# Patient Record
Sex: Male | Born: 1965 | Race: White | Hispanic: No | Marital: Married | Smoking: Former smoker
Health system: Southern US, Community
[De-identification: ages and names within clinical notes are randomized; demographics above are authoritative.]

## PROBLEM LIST (undated history)

## (undated) DIAGNOSIS — I10 Essential (primary) hypertension: Secondary | ICD-10-CM

## (undated) DIAGNOSIS — M1990 Unspecified osteoarthritis, unspecified site: Secondary | ICD-10-CM

## (undated) DIAGNOSIS — W3400XA Accidental discharge from unspecified firearms or gun, initial encounter: Secondary | ICD-10-CM

## (undated) DIAGNOSIS — E785 Hyperlipidemia, unspecified: Secondary | ICD-10-CM

## (undated) DIAGNOSIS — J309 Allergic rhinitis, unspecified: Secondary | ICD-10-CM

## (undated) DIAGNOSIS — I639 Cerebral infarction, unspecified: Secondary | ICD-10-CM

## (undated) HISTORY — DX: Unspecified osteoarthritis, unspecified site: M19.90

## (undated) HISTORY — PX: OTHER SURGICAL HISTORY: SHX169

## (undated) HISTORY — PX: HERNIA REPAIR: SHX51

## (undated) HISTORY — DX: Hyperlipidemia, unspecified: E78.5

## (undated) HISTORY — DX: Allergic rhinitis, unspecified: J30.9

## (undated) HISTORY — DX: Accidental discharge from unspecified firearms or gun, initial encounter: W34.00XA

---

## 2020-06-25 ENCOUNTER — Other Ambulatory Visit: Payer: Self-pay

## 2020-06-25 ENCOUNTER — Encounter (HOSPITAL_COMMUNITY): Payer: Self-pay | Admitting: *Deleted

## 2020-06-25 ENCOUNTER — Emergency Department (HOSPITAL_COMMUNITY): Payer: 59

## 2020-06-25 ENCOUNTER — Emergency Department (HOSPITAL_COMMUNITY)
Admission: EM | Admit: 2020-06-25 | Discharge: 2020-06-25 | Discharge status: Against Medical Advice | Payer: 59 | Attending: Emergency Medicine | Admitting: Emergency Medicine

## 2020-06-25 DIAGNOSIS — R5381 Other malaise: Secondary | ICD-10-CM | POA: Diagnosis not present

## 2020-06-25 DIAGNOSIS — R4781 Slurred speech: Secondary | ICD-10-CM | POA: Diagnosis not present

## 2020-06-25 DIAGNOSIS — R4189 Other symptoms and signs involving cognitive functions and awareness: Secondary | ICD-10-CM | POA: Insufficient documentation

## 2020-06-25 DIAGNOSIS — Z8673 Personal history of transient ischemic attack (TIA), and cerebral infarction without residual deficits: Secondary | ICD-10-CM | POA: Diagnosis not present

## 2020-06-25 DIAGNOSIS — I10 Essential (primary) hypertension: Secondary | ICD-10-CM | POA: Insufficient documentation

## 2020-06-25 DIAGNOSIS — R110 Nausea: Secondary | ICD-10-CM | POA: Diagnosis not present

## 2020-06-25 DIAGNOSIS — R410 Disorientation, unspecified: Secondary | ICD-10-CM

## 2020-06-25 DIAGNOSIS — R42 Dizziness and giddiness: Secondary | ICD-10-CM

## 2020-06-25 HISTORY — DX: Essential (primary) hypertension: I10

## 2020-06-25 HISTORY — DX: Cerebral infarction, unspecified: I63.9

## 2020-06-25 LAB — CBC
HCT: 45.6 % (ref 39.0–52.0)
Hemoglobin: 15.7 g/dL (ref 13.0–17.0)
MCH: 30.9 pg (ref 26.0–34.0)
MCHC: 34.4 g/dL (ref 30.0–36.0)
MCV: 89.8 fL (ref 80.0–100.0)
Platelets: 197 10*3/uL (ref 150–400)
RBC: 5.08 MIL/uL (ref 4.22–5.81)
RDW: 12.8 % (ref 11.5–15.5)
WBC: 7.6 10*3/uL (ref 4.0–10.5)
nRBC: 0 % (ref 0.0–0.2)

## 2020-06-25 LAB — COMPREHENSIVE METABOLIC PANEL
ALT: 33 U/L (ref 0–44)
AST: 36 U/L (ref 15–41)
Albumin: 4.2 g/dL (ref 3.5–5.0)
Alkaline Phosphatase: 62 U/L (ref 38–126)
Anion gap: 10 (ref 5–15)
BUN: 18 mg/dL (ref 6–20)
CO2: 22 mmol/L (ref 22–32)
Calcium: 8.9 mg/dL (ref 8.9–10.3)
Chloride: 105 mmol/L (ref 98–111)
Creatinine, Ser: 1.11 mg/dL (ref 0.61–1.24)
GFR, Estimated: >60 mL/min (ref >60)
Glucose, Bld: 101 mg/dL — ABNORMAL HIGH (ref 70–99)
Potassium: 4.6 mmol/L (ref 3.5–5.1)
Sodium: 137 mmol/L (ref 135–145)
Total Bilirubin: 0.6 mg/dL (ref 0.3–1.2)
Total Protein: 6.9 g/dL (ref 6.5–8.1)

## 2020-06-25 LAB — DIFFERENTIAL
Abs Immature Granulocytes: 0.03 10*3/uL (ref 0.00–0.07)
Basophils Absolute: 0.1 10*3/uL (ref 0.0–0.1)
Basophils Relative: 1 %
Eosinophils Absolute: 0.3 10*3/uL (ref 0.0–0.5)
Eosinophils Relative: 4 %
Immature Granulocytes: 0 %
Lymphocytes Relative: 36 %
Lymphs Abs: 2.7 10*3/uL (ref 0.7–4.0)
Monocytes Absolute: 0.6 10*3/uL (ref 0.1–1.0)
Monocytes Relative: 8 %
Neutro Abs: 3.9 10*3/uL (ref 1.7–7.7)
Neutrophils Relative %: 51 %

## 2020-06-25 LAB — CBG MONITORING, ED: Glucose-Capillary: 95 mg/dL (ref 70–99)

## 2020-06-25 LAB — PROTIME-INR
INR: 1 (ref 0.8–1.2)
Prothrombin Time: 12.7 seconds (ref 11.4–15.2)

## 2020-06-25 LAB — APTT: aPTT: 27 seconds (ref 24–36)

## 2020-06-25 IMAGING — CT CT ANGIO NECK
2 of 7 series · 8 of 33 positions shown · IV contrast (Omnipaque or Isovue)
Comparison: CT head [DATE]

CLINICAL DATA: Stroke/TIA.  Dizziness

EXAM:
CT ANGIOGRAPHY HEAD AND NECK
TECHNIQUE: Multidetector CT imaging of the head and neck was performed using
the standard protocol during bolus administration of intravenous
contrast. Multiplanar CT image reconstructions and MIPs were
obtained to evaluate the vascular anatomy. Carotid stenosis
measurements (when applicable) are obtained utilizing NASCET
criteria, using the distal internal carotid diameter as the
denominator.
CONTRAST:  75mL OMNIPAQUE IOHEXOL 350 MG/ML SOLN

[Series 5: cta head & neck · axial · 0.47mm/px · z∈[+1240,+1360]mm · 2 of 181 slices shown]
[im 61/181  soft-tissue]
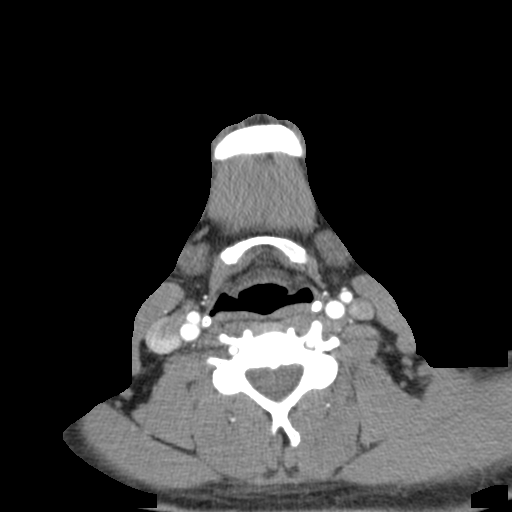
[im 121/181  soft-tissue]
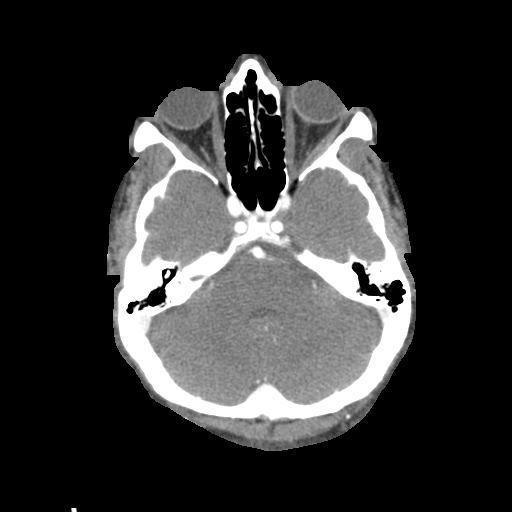

[Series 7: ax thins · axial · 0.45mm/px · z∈[+1172,+1429]mm · 6 of 361 slices shown]
[im 52/361  soft-tissue]
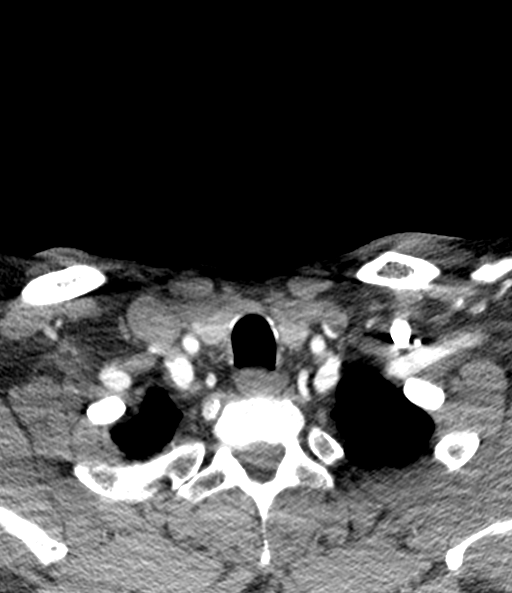
[im 103/361  bone]
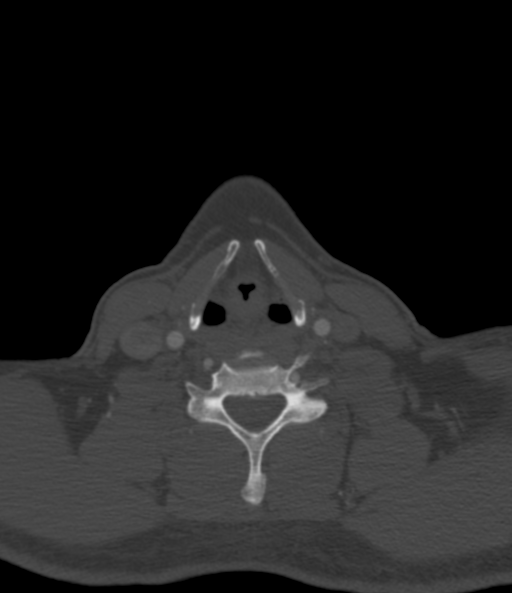
[im 155/361  soft-tissue]
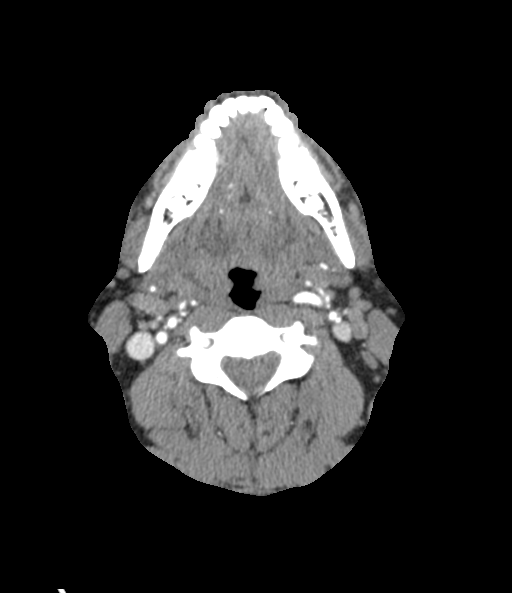
[im 206/361  bone]
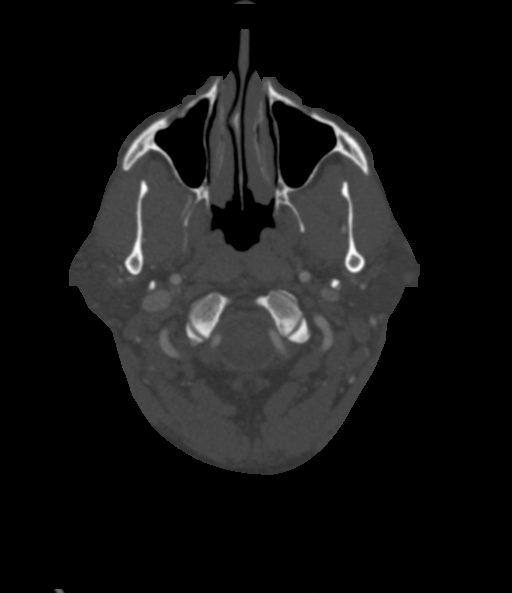
[im 258/361  soft-tissue]
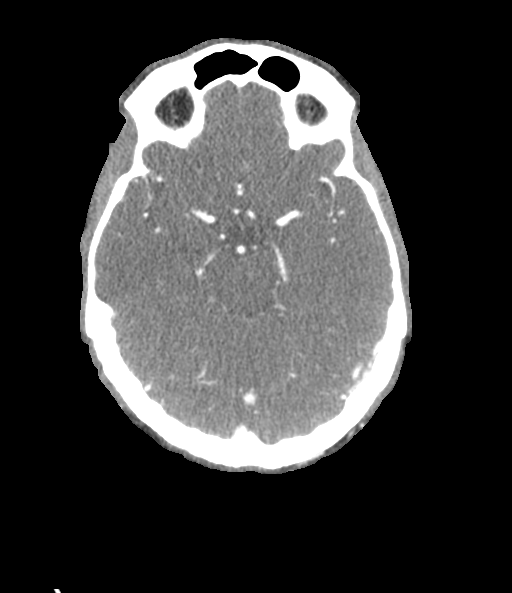
[im 309/361  bone]
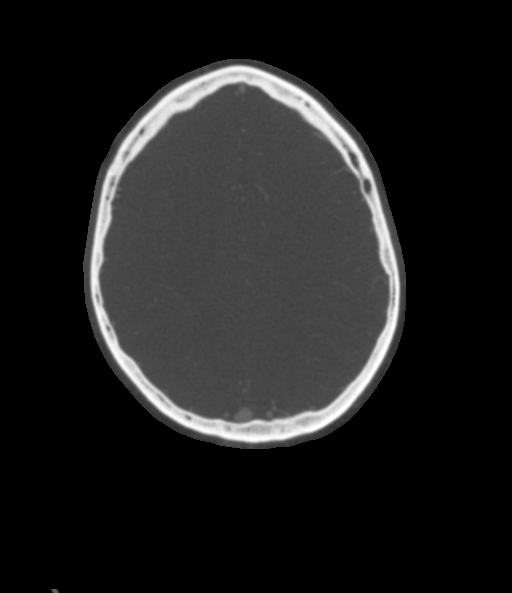

[8 of 33 positions shown; findings below may reference images not displayed]

FINDINGS: CTA NECK FINDINGS

Aortic arch: Standard branching. Imaged portion shows no evidence of
aneurysm or dissection. No significant stenosis of the major arch
vessel origins.

Right carotid system: Normal right carotid. Negative for stenosis or
atherosclerotic disease

Left carotid system: Normal left carotid. Negative for stenosis or
atherosclerotic disease.

Vertebral arteries: Normal vertebral artery bilaterally

Skeleton: Mild cervical degenerative change. No acute skeletal
abnormality.

Other neck: Negative for mass or adenopathy.

Upper chest: Lung apices clear bilaterally.

Review of the MIP images confirms the above findings

CTA HEAD FINDINGS

Anterior circulation: Normal cavernous carotid bilaterally without
stenosis. Anterior and middle cerebral arteries patent bilaterally
without stenosis. No large vessel occlusion.

Posterior circulation: Both vertebral arteries patent to the
basilar. PICA patent. Basilar widely patent. AICA, superior
cerebellar, posterior cerebral arteries normal bilaterally.

Venous sinuses: Normal venous enhancement

Anatomic variants: None

Review of the MIP images confirms the above findings
IMPRESSION: 1. Negative CTA head and neck.
2. No intracranial large vessel occlusion
3. No significant carotid or vertebral artery stenosis.

## 2020-06-25 IMAGING — CT CT HEAD CODE STROKE
3 series · 16 of 47 positions shown, 19 images · non-contrast
Comparison: None.

CLINICAL DATA: Code stroke.  Acute neuro deficit.  Dizziness.

EXAM:
CT HEAD WITHOUT CONTRAST
TECHNIQUE: Contiguous axial images were obtained from the base of the skull
through the vertex without intravenous contrast.

[Series 2: head w o · axial · 0.48mm/px · z∈[+1275,+1440]mm · 10 of 39 slices shown, 13 images]
[im 3/39  brain]
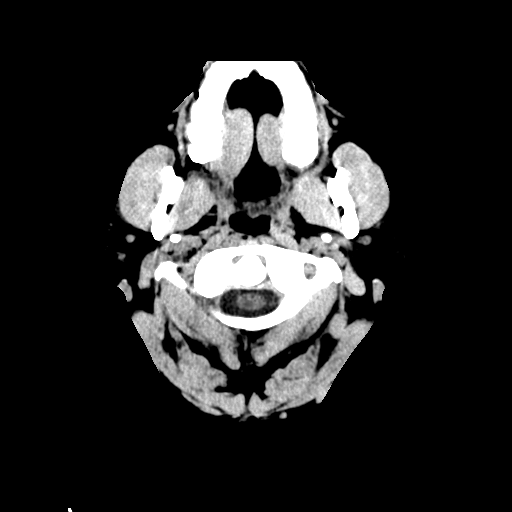
[im 3/39  bone]
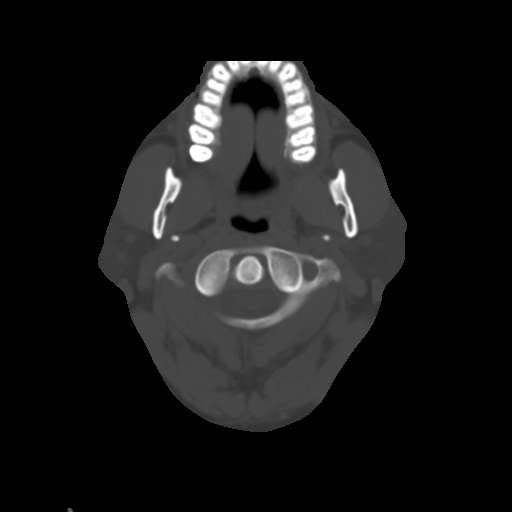
[im 7/39  brain]
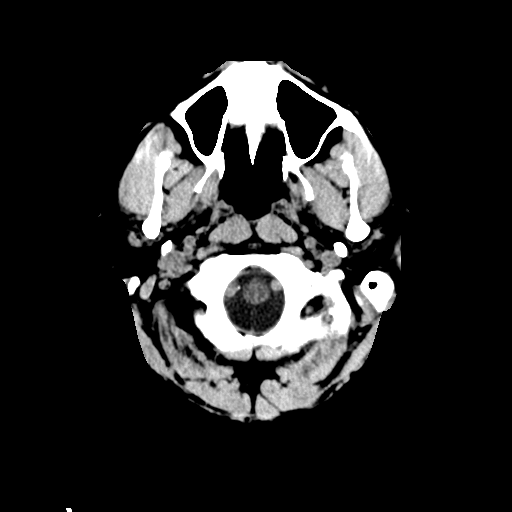
[im 11/39  brain]
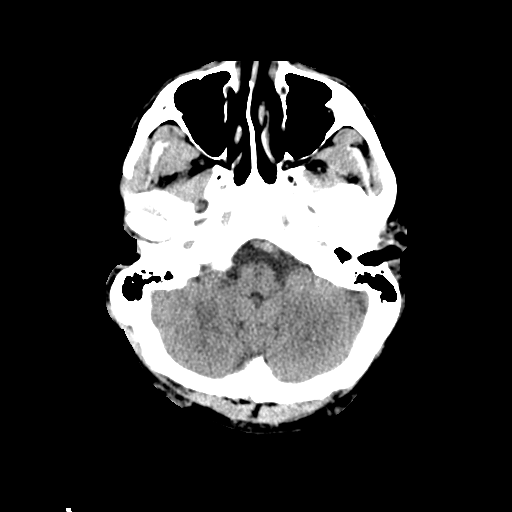
[im 14/39  brain]
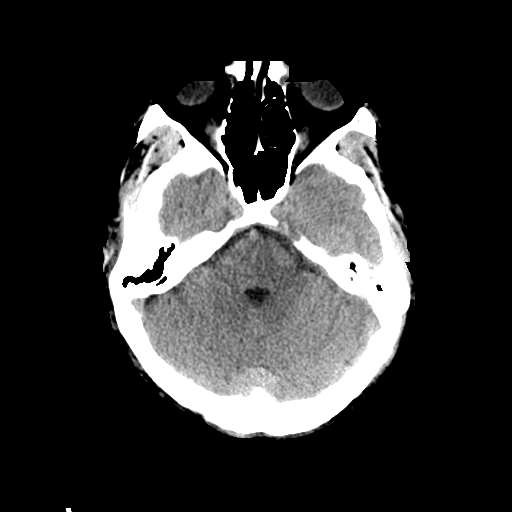
[im 18/39  brain]
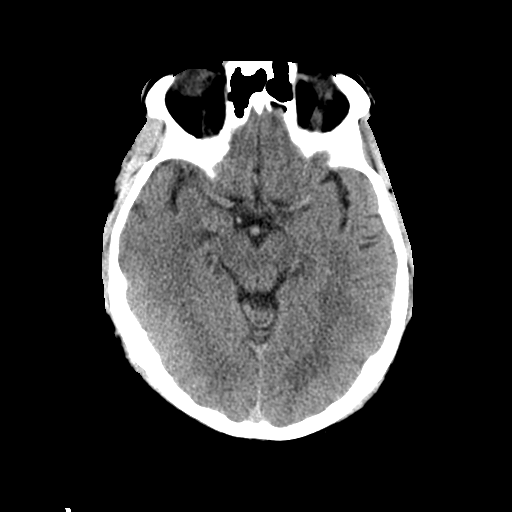
[im 18/39  bone]
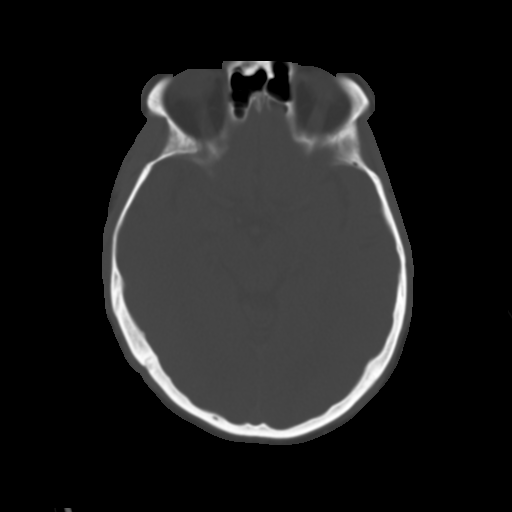
[im 21/39  brain]
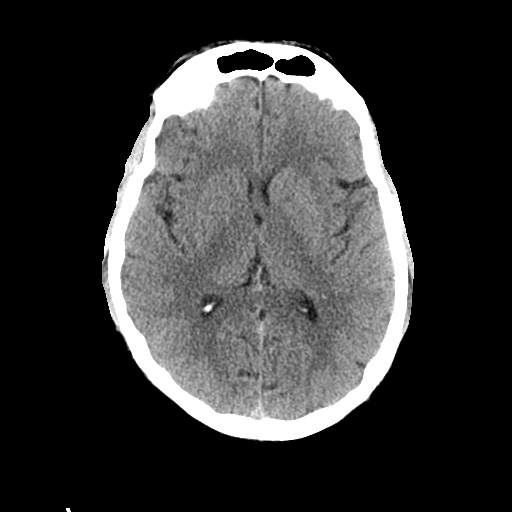
[im 25/39  brain]
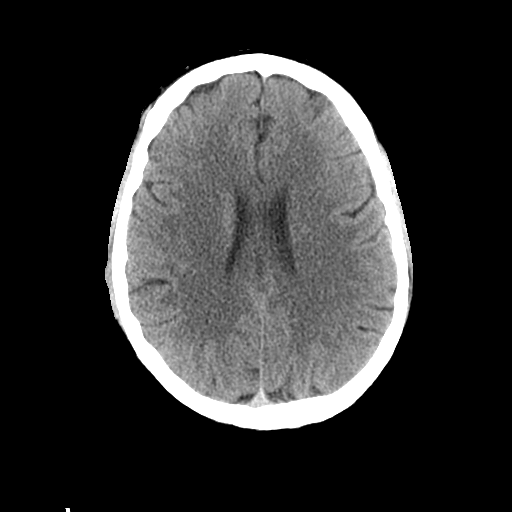
[im 29/39  brain]
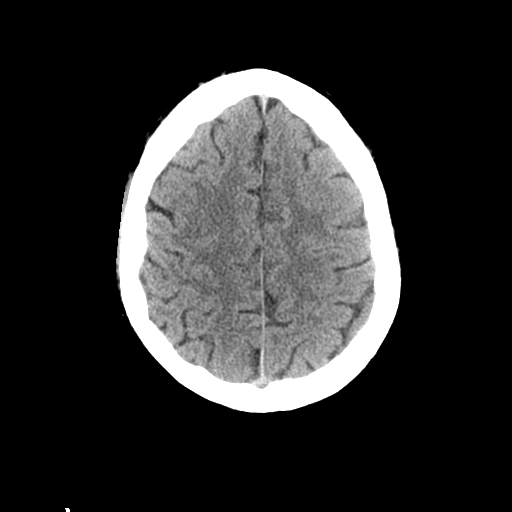
[im 32/39  brain]
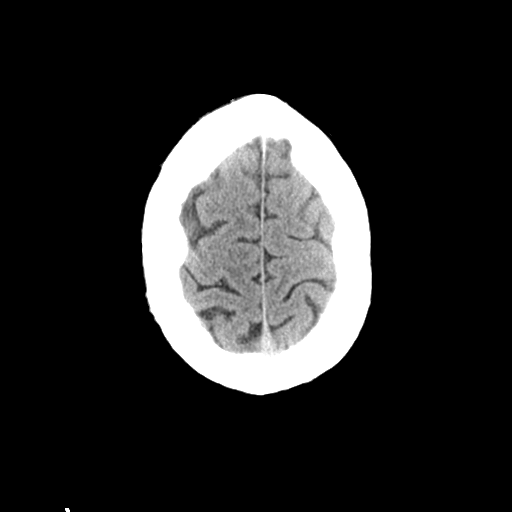
[im 32/39  bone]
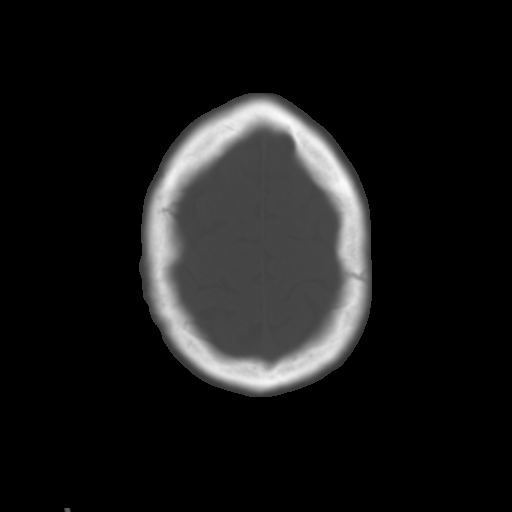
[im 36/39  brain]
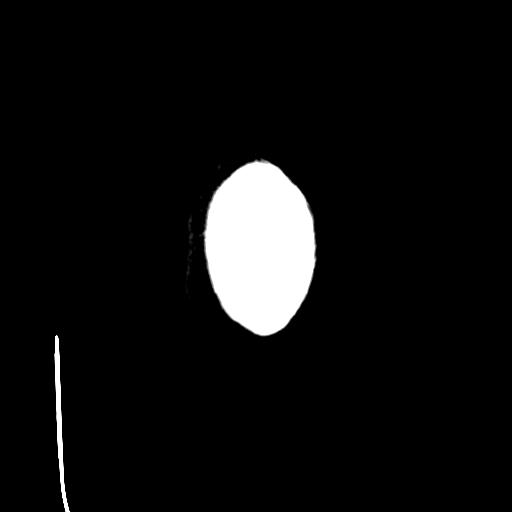

[Series 4: coronal soft · coronal · 0.36mm/px · 3 of 79 slices shown]
[im 27/79  brain]
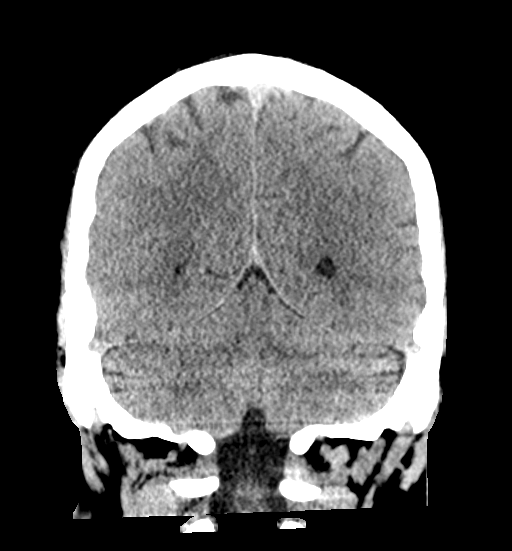
[im 35/79  brain]
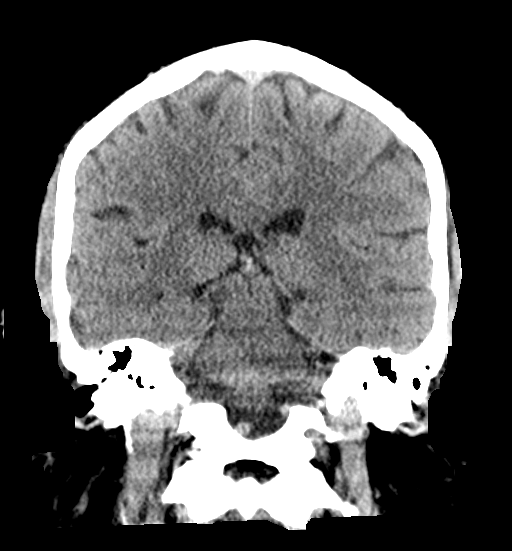
[im 44/79  brain]
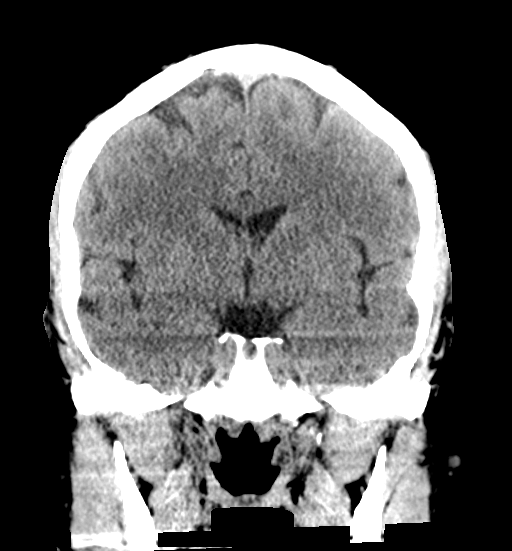

[Series 5: sagittal soft · sagittal · 0.41mm/px · 3 of 62 slices shown]
[im 21/62  brain]
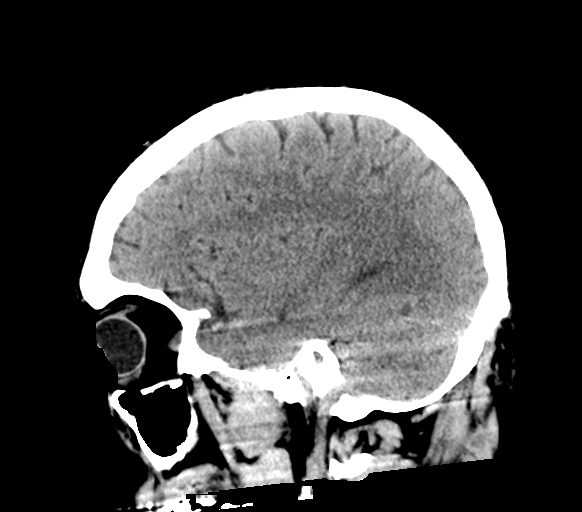
[im 31/62  brain]
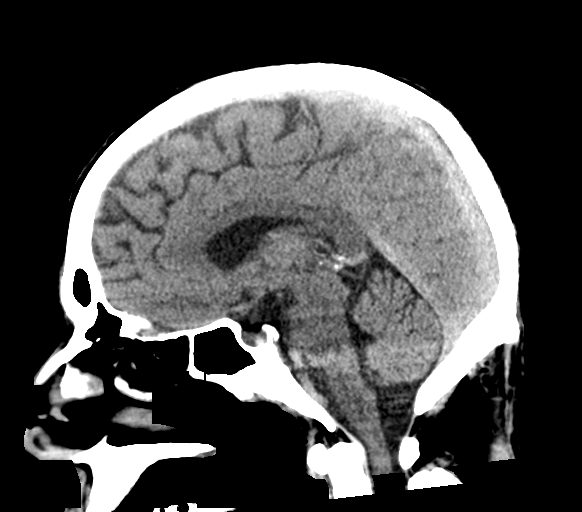
[im 41/62  brain]
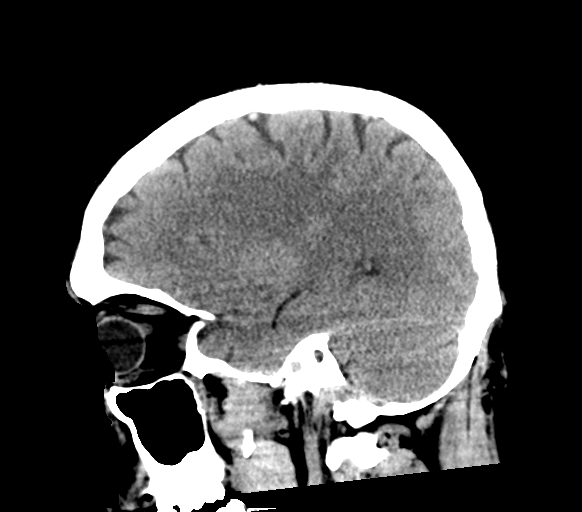

[16 of 47 positions shown; findings below may reference images not displayed]

FINDINGS: Brain: No evidence of acute infarction, hemorrhage, hydrocephalus,
extra-axial collection or mass lesion/mass effect.

Vascular: Negative for hyperdense vessel

Skull: Negative

Sinuses/Orbits: Paranasal sinuses clear.  Negative orbit

Other: None

ASPECTS (Alberta Stroke Program Early CT Score)

- Ganglionic level infarction (caudate, lentiform nuclei, internal
capsule, insula, M1-M3 cortex): 7

- Supraganglionic infarction (M4-M6 cortex): 3

Total score (0-10 with 10 being normal): 10
IMPRESSION: 1. Negative CT head
2. ASPECTS is 10
3. These results were called by telephone at the time of
interpretation on [DATE] at [DATE] to provider AKE , who
verbally acknowledged these results.

## 2020-06-25 MED ORDER — SODIUM CHLORIDE 0.9% FLUSH: 3.0000 mL | Freq: Once | INTRAVENOUS | Status: DC

## 2020-06-25 MED ORDER — IOHEXOL 350 MG/ML SOLN
75.0000 mL | Freq: Once | INTRAVENOUS | Status: AC | PRN
  Administered 2020-06-25: 75 mL via INTRAVENOUS
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
## 2020-06-25 NOTE — Discharge Instructions (Addendum)
You are leaving the emergency department AGAINST MEDICAL ADVICE.  We discussed the risks of leaving the hospital at this time, including stroke, seizure, death.  As discussed, you can return to the emergency department at any time for continued care.

## 2020-06-25 NOTE — ED Provider Notes (Signed)
[facility]-EMERGENCY DEPT [facility] Emergency Department Provider Note MRN:  [id number]  Arrival date & time: 06/25/20     Chief Complaint   Dizziness   History of Present Illness   Richard Wagner is a 55 y.o. year-old male with a history of hypertension, presenting to the ED with chief complaint of dizziness.  Patient was his normal self at 5 PM, he got into the car and went to the store.  He called his wife at 69 complaining of severe dizziness, malaise, nausea.  He was exhibiting slurred speech on the phone.  He pulled the car over.  He is now in the emergency department minimally responsive.  I was unable to obtain an accurate HPI, PMH, or ROS due to the patient's altered mental status.  Level 5 caveat.  Review of Systems  A complete 10 system review of systems was obtained and all systems are negative except as noted in the HPI and PMH.   Patient's Health History    Past Medical History:  Diagnosis Date  . Hypertension   . Stroke [facility])     History reviewed. No pertinent surgical history.  History reviewed. No pertinent family history.  Social History   Socioeconomic History  . Marital status: Single    Spouse name: Not on file  . Number of children: Not on file  . Years of education: Not on file  . Highest education level: Not on file  Occupational History  . Not on file  Tobacco Use  . Smoking status: Not on file  . Smokeless tobacco: Not on file  Substance and Sexual Activity  . Alcohol use: Not on file  . Drug use: Not on file  . Sexual activity: Not on file  Other Topics Concern  . Not on file  Social History Narrative  . Not on file   Social Determinants of Health   Financial Resource Strain: Not on file  Food Insecurity: Not on file  Transportation Needs: Not on file  Physical Activity: Not on file  Stress: Not on file  Social Connections: Not on file  Intimate Partner Violence: Not on file     Physical Exam   Vitals:   06/25/20 1918  06/25/20 2000  BP: (!) 147/96 (!) 145/127  Pulse: 68 63  Resp: 14 16  Temp: 97.8 F (36.6 C)   SpO2: 96% 95%    CONSTITUTIONAL: Well-appearing, NAD NEURO: Somnolent, responds minimally to painful stimulus EYES:  eyes equal and reactive ENT/NECK:  no LAD, no JVD CARDIO: Regular rate, well-perfused, normal S1 and S2 PULM:  CTAB no wheezing or rhonchi GI/GU:  normal bowel sounds, non-distended, non-tender MSK/SPINE:  No gross deformities, no edema SKIN:  no rash, atraumatic PSYCH:  Appropriate speech and behavior  *Additional and/or pertinent findings included in MDM below  Diagnostic and Interventional Summary    EKG Interpretation  Date/Time:  Saturday June 25 2020 18:33:48 EST Ventricular Rate:  67 PR Interval:    QRS Duration: 87 QT Interval:  414 QTC Calculation: 437 R Axis:   66 Text Interpretation: Sinus rhythm RSR' in V1 or V2, right VCD or RVH Probable anterolateral infarct, old Confirmed by Kennis Carina [id number]) on 06/25/2020 8:16:39 PM      Labs Reviewed  COMPREHENSIVE METABOLIC PANEL - Abnormal; Notable for the following components:      Result Value   Glucose, Bld 101 (*)    All other components within normal limits  PROTIME-INR  APTT  CBC  DIFFERENTIAL  CBG MONITORING,  ED  I-STAT CHEM 8, ED    CT Angio Head W or Wo Contrast  Final Result    CT Angio Neck W and/or Wo Contrast  Final Result    CT HEAD CODE STROKE WO CONTRAST  Final Result      Medications  sodium chloride flush (NS) 0.9 % injection 3 mL (has no administration in time range)  iohexol (OMNIPAQUE) 350 MG/ML injection 75 mL (75 mLs Intravenous Contrast Given 06/25/20 1850)     Procedures  /  Critical Care .Critical Care Performed by: Sabas Sous, MD Authorized by: Sabas Sous, MD   Critical care provider statement:    Critical care time (minutes):  35   Critical care was necessary to treat or prevent imminent or life-threatening deterioration of the following  conditions: Concern for acute ischemic stroke, initiation of code stroke protocol.   Critical care was time spent personally by me on the following activities:  Discussions with consultants, evaluation of patient's response to treatment, examination of patient, ordering and performing treatments and interventions, ordering and review of laboratory studies, ordering and review of radiographic studies, pulse oximetry, re-evaluation of patient's condition, obtaining history from patient or surrogate and review of old charts    ED Course and Medical Decision Making  I have reviewed the triage vital signs, the nursing notes, and pertinent available records from the EMR.  Listed above are laboratory and imaging tests that I personally ordered, reviewed, and interpreted and then considered in my medical decision making (see below for details).  Given the severe dizziness, slurred speech now somnolence there is concern for acute ischemic stroke versus hemorrhagic stroke.  Code stroke was initiated.  Noncon CT reassuring, patient had a spontaneous recovery while in CT, he is now much more awake and alert.  Family denies any concern over drug use.  Work-up thus far is reassuring.  Evaluated by neurology who recommends admission, MRI, EEG.  Patient is refusing all this, he wishes to go home we explained the risks of leaving at this time and still patient is refusing further treatment.  He is leaving the hospital AGAINST MEDICAL ADVICE, fully aware of the risks of stroke, seizure, death if he were to leave       Elmer Sow. Pilar Plate, MD [facility] Emergency Medicine [facility]ician [facility] [email or web address]  Final Clinical Impressions(s) / ED Diagnoses     ICD-10-CM   1. Dizziness  R42   2. Slurred speech  R47.81   3. Unresponsive episode  R41.89     ED Discharge Orders    None       Discharge Instructions Discussed with and Provided to Patient:     Discharge Instructions     You are  leaving the emergency department AGAINST MEDICAL ADVICE.  We discussed the risks of leaving the hospital at this time, including stroke, seizure, death.  As discussed, you can return to the emergency department at any time for continued care.      Sabas Sous, MD 06/25/20 2033

## 2020-06-25 NOTE — ED Notes (Signed)
Pt to CT

## 2020-06-25 NOTE — Progress Notes (Signed)
Code Stroke Time Documentation   1827 Call Time  Beeper Time 1830 Exam Started  1837 Exam Finished 1839 Images sent to [facility] 1839 Exam completed in Epic  [phone] [facility] radiology called

## 2020-06-25 NOTE — ED Notes (Signed)
Tele-neurologists at bedside

## 2020-06-25 NOTE — ED Triage Notes (Signed)
Pt with dizziness, vision changes and slurred speech at 1715.  Last seen well at 1700.  Pt lethargic.

## 2020-06-28 ENCOUNTER — Other Ambulatory Visit: Payer: Self-pay

## 2020-06-28 ENCOUNTER — Encounter: Payer: Self-pay | Admitting: Nurse Practitioner

## 2020-06-28 ENCOUNTER — Ambulatory Visit (INDEPENDENT_AMBULATORY_CARE_PROVIDER_SITE_OTHER): Payer: 59 | Admitting: Nurse Practitioner

## 2020-06-28 VITALS — BP 144/100 | HR 74 | Temp 98.3°F | Resp 18 | Ht 73.0 in | Wt 240.0 lb

## 2020-06-28 DIAGNOSIS — I10 Essential (primary) hypertension: Secondary | ICD-10-CM | POA: Diagnosis not present

## 2020-06-28 DIAGNOSIS — M79671 Pain in right foot: Secondary | ICD-10-CM

## 2020-06-28 DIAGNOSIS — M79672 Pain in left foot: Secondary | ICD-10-CM

## 2020-06-28 DIAGNOSIS — Z7689 Persons encountering health services in other specified circumstances: Secondary | ICD-10-CM

## 2020-06-28 DIAGNOSIS — Z23 Encounter for immunization: Secondary | ICD-10-CM

## 2020-06-28 DIAGNOSIS — R42 Dizziness and giddiness: Secondary | ICD-10-CM

## 2020-06-28 MED ORDER — LOSARTAN POTASSIUM 50 MG PO TABS: 50.0000 mg | ORAL_TABLET | Freq: Every day | ORAL | 3 refills | Status: DC
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
## 2020-06-28 NOTE — Patient Instructions (Addendum)
It was nice meeting you today.  We will meet back up in 1-2 weeks for a physical exam.  Please get fasting labs 2-3 days prior to your next visit.  For your high blood pressure, I called in losartan. Please take this as prescribed and check your BP at least once per day. Please record your reading and bring in a log of those readings at our next visit.  If you have any neurological issues or lightheadedness gets worse, report to the emergency department for imaging and treatment.  Plantar Fasciitis   Plantar fasciitis is a painful foot condition that affects the heel. It occurs when the band of tissue that connects the toes to the heel bone (plantar fascia) becomes irritated. This can happen as the result of exercising too much or doing other repetitive activities (overuse injury). The pain from plantar fasciitis can range from mild irritation to severe pain that makes it difficult to walk or move. The pain is usually worse in the morning after sleeping, or after sitting or lying down for a while. Pain may also be worse after long periods of walking or standing. What are the causes? This condition may be caused by:  Standing for long periods of time.  Wearing shoes that do not have good arch support.  Doing activities that put stress on joints (high-impact activities), including running, aerobics, and ballet.  Being overweight.  An abnormal way of walking (gait).  Tight muscles in the back of your lower leg (calf).  High arches in your feet.  Starting a new athletic activity. What are the signs or symptoms? The main symptom of this condition is heel pain. Pain may:  Be worse with first steps after a time of rest, especially in the morning after sleeping or after you have been sitting or lying down for a while.  Be worse after long periods of standing still.  Decrease after 30-45 minutes of activity, such as gentle walking. How is this diagnosed? This condition may be diagnosed  based on your medical history and your symptoms. Your health care provider may ask questions about your activity level. Your health care provider will do a physical exam to check for:  A tender area on the bottom of your foot.  A high arch in your foot.  Pain when you move your foot.  Difficulty moving your foot. You may have imaging tests to confirm the diagnosis, such as:  X-rays.  Ultrasound.  MRI. How is this treated? Treatment for plantar fasciitis depends on how severe your condition is. Treatment may include:  Rest, ice, applying pressure (compression), and raising the affected foot (elevation). This may be called RICE therapy. Your health care provider may recommend RICE therapy along with over-the-counter pain medicines to manage your pain.  Exercises to stretch your calves and your plantar fascia.  A splint that holds your foot in a stretched, upward position while you sleep (night splint).  Physical therapy to relieve symptoms and prevent problems in the future.  Injections of steroid medicine (cortisone) to relieve pain and inflammation.  Stimulating your plantar fascia with electrical impulses (extracorporeal shock wave therapy). This is usually the last treatment option before surgery.  Surgery, if other treatments have not worked after 12 months. Follow these instructions at home:   Managing pain, stiffness, and swelling  If directed, put ice on the painful area: ? Put ice in a plastic bag, or use a frozen bottle of water. ? Place a towel between your skin  and the bag or bottle. ? Roll the bottom of your foot over the bag or bottle. ? Do this for 20 minutes, 2-3 times a day.  Wear athletic shoes that have air-sole or gel-sole cushions, or try wearing soft shoe inserts that are designed for plantar fasciitis.  Raise (elevate) your foot above the level of your heart while you are sitting or lying down. Activity  Avoid activities that cause pain. Ask your  health care provider what activities are safe for you.  Do physical therapy exercises and stretches as told by your health care provider.  Try activities and forms of exercise that are easier on your joints (low-impact). Examples include swimming, water aerobics, and biking. General instructions  Take over-the-counter and prescription medicines only as told by your health care provider.  Wear a night splint while sleeping, if told by your health care provider. Loosen the splint if your toes tingle, become numb, or turn cold and blue.  Maintain a healthy weight, or work with your health care provider to lose weight as needed.  Keep all follow-up visits as told by your health care provider. This is important. Contact a health care provider if you:  Have symptoms that do not go away after caring for yourself at home.  Have pain that gets worse.  Have pain that affects your ability to move or do your daily activities. Summary  Plantar fasciitis is a painful foot condition that affects the heel. It occurs when the band of tissue that connects the toes to the heel bone (plantar fascia) becomes irritated.  The main symptom of this condition is heel pain that may be worse after exercising too much or standing still for a long time.  Treatment varies, but it usually starts with rest, ice, compression, and elevation (RICE therapy) and over-the-counter medicines to manage pain. This information is not intended to replace advice given to you by your health care provider. Make sure you discuss any questions you have with your health care provider. Document Revised: 05/24/2017 Document Reviewed: 04/08/2017 Elsevier Patient Education  [place].    Plantar Fasciitis Rehab Ask your health care provider which exercises are safe for you. Do exercises exactly as told by your health care provider and adjust them as directed. It is normal to feel mild stretching, pulling, tightness, or  discomfort as you do these exercises. Stop right away if you feel sudden pain or your pain gets worse. Do not begin these exercises until told by your health care provider. Stretching and range-of-motion exercises These exercises warm up your muscles and joints and improve the movement and flexibility of your foot. These exercises also help to relieve pain. Plantar fascia stretch   1. Sit with your left / right leg crossed over your opposite knee. 2. Hold your heel with one hand with that thumb near your arch. With your other hand, hold your toes and gently pull them back toward the top of your foot. You should feel a stretch on the bottom of your toes or your foot (plantar fascia) or both. 3. Hold this stretch for__________ seconds. 4. Slowly release your toes and return to the starting position. Repeat __________ times. Complete this exercise __________ times a day. Gastrocnemius stretch, standing  This exercise is also called a calf (gastroc) stretch. It stretches the muscles in the back of the upper calf. 1. Stand with your hands against a wall. 2. Extend your left / right leg behind you, and bend your front knee  slightly. 3. Keeping your heels on the floor and your back knee straight, shift your weight toward the wall. Do not arch your back. You should feel a gentle stretch in your upper left / right calf. 4. Hold this position for __________ seconds. Repeat __________ times. Complete this exercise __________ times a day. Soleus stretch, standing This exercise is also called a calf (soleus) stretch. It stretches the muscles in the back of the lower calf. 1. Stand with your hands against a wall. 2. Extend your left / right leg behind you, and bend your front knee slightly. 3. Keeping your heels on the floor, bend your back knee and shift your weight slightly over your back leg. You should feel a gentle stretch deep in your lower calf. 4. Hold this position for __________ seconds. Repeat  __________ times. Complete this exercise __________ times a day. Gastroc and soleus stretch, standing step This exercise stretches the muscles in the back of the lower leg. These muscles are in the upper calf (gastrocnemius) and the lower calf (soleus). 1. Stand with the ball of your left / right foot on a step. The ball of your foot is on the walking surface, right under your toes. 2. Keep your other foot firmly on the same step. 3. Hold on to the wall or a railing for balance. 4. Slowly lift your other foot, allowing your body weight to press your left / right heel down over the edge of the step. You should feel a stretch in your left / right calf. 5. Hold this position for __________ seconds. 6. Return both feet to the step. 7. Repeat this exercise with a slight bend in your left / right knee. Repeat __________ times with your left / right knee straight and __________ times with your left / right knee bent. Complete this exercise __________ times a day. Balance exercise This exercise builds your balance and strength control of your arch to help take pressure off your plantar fascia. Single leg stand If this exercise is too easy, you can try it with your eyes closed or while standing on a pillow. 1. Without shoes, stand near a railing or in a doorway. You may hold on to the railing or door frame as needed. 2. Stand on your left / right foot. Keep your big toe down on the floor and try to keep your arch lifted. Do not let your foot roll inward. 3. Hold this position for __________ seconds. Repeat __________ times. Complete this exercise __________ times a day. This information is not intended to replace advice given to you by your health care provider. Make sure you discuss any questions you have with your health care provider. Document Revised: 10/02/2018 Document Reviewed: 04/09/2018 Elsevier Patient Education  2020 [employer].

## 2020-06-28 NOTE — Assessment & Plan Note (Signed)
-  flu shot administered today °

## 2020-06-28 NOTE — Assessment & Plan Note (Addendum)
-  BP 144/100 today -Rx. Losartan -recheck BP in 1-2 weeks at time of physical -encouraged home BP checks

## 2020-06-28 NOTE — Assessment & Plan Note (Signed)
-  likely plantar fasciitis based on symptoms (worse in AM, tightness in bottom of foot) -hand out of stretches provided

## 2020-06-28 NOTE — Assessment & Plan Note (Signed)
-  will screen for HIV and HCV -will discuss other preventative maintenance at future visits

## 2020-06-28 NOTE — Assessment & Plan Note (Signed)
-  he had a likely TIA on 06/25/20, but left AMA before getting MRI of brain -CT was negative -he still feels a little lightheaded -he states he has hx of CVA -will treat BP which has been elevated -no focal deficits today -if he has any further neuro symptoms he should go to ED immeidately

## 2020-07-02 LAB — LIPID PANEL WITH LDL/HDL RATIO
Cholesterol, Total: 255 mg/dL — ABNORMAL HIGH (ref 100–199)
HDL: 39 mg/dL — ABNORMAL LOW (ref >39)
LDL Chol Calc (NIH): 89 mg/dL (ref 0–99)
LDL/HDL Ratio: 2.3 ratio (ref 0.0–3.6)
Triglycerides: 769 mg/dL (ref 0–149)
VLDL Cholesterol Cal: 127 mg/dL — ABNORMAL HIGH (ref 5–40)

## 2020-07-02 LAB — HEMOGLOBIN A1C
Est. average glucose Bld gHb Est-mCnc: 114 mg/dL
Hgb A1c MFr Bld: 5.6 % (ref 4.8–5.6)

## 2020-07-04 ENCOUNTER — Encounter: Payer: Self-pay | Admitting: Nurse Practitioner

## 2020-07-04 ENCOUNTER — Telehealth: Payer: Self-pay

## 2020-07-04 ENCOUNTER — Other Ambulatory Visit: Payer: Self-pay | Admitting: Nurse Practitioner

## 2020-07-04 MED ORDER — FENOFIBRATE 145 MG PO TABS: 145.0000 mg | ORAL_TABLET | Freq: Every day | ORAL | 1 refills | Status: DC

## 2020-07-04 MED ORDER — OMEGA-3-ACID ETHYL ESTERS 1 G PO CAPS: 2.0000 g | ORAL_CAPSULE | Freq: Two times a day (BID) | ORAL | 1 refills | Status: DC

## 2020-07-04 MED ORDER — ICOSAPENT ETHYL 1 G PO CAPS: 2.0000 g | ORAL_CAPSULE | Freq: Two times a day (BID) | ORAL | 5 refills | Status: DC

## 2020-07-04 NOTE — Telephone Encounter (Signed)
Per pharmacy - pt insurance does not cover the Vascepa 1 gram capsule, insurance will cover Lovaza 1 gram capsules.

## 2020-07-04 NOTE — Progress Notes (Signed)
Your triglycerides are significantly elevated.  Did you eat anything prior to getting your labs drawn? If not, I will send something in to bring your triglycerides down.

## 2020-07-04 NOTE — Progress Notes (Signed)
I sent in fenofibrate as well as Vascepa. Fenofibrate should be prefererd by your insurance. Vascepa may be expensive. If vascepa is expensive, you don't have to fill it. Just get fish oil capsules over-the-counter and take 2 grams (or 2000 mg) of fish oil twice per day. Usually, it is a total of 4 capsules per day.

## 2020-07-04 NOTE — Telephone Encounter (Signed)
Sent in Lovaza

## 2020-07-05 ENCOUNTER — Ambulatory Visit (INDEPENDENT_AMBULATORY_CARE_PROVIDER_SITE_OTHER): Payer: 59 | Admitting: Nurse Practitioner

## 2020-07-05 ENCOUNTER — Other Ambulatory Visit: Payer: Self-pay

## 2020-07-05 ENCOUNTER — Encounter: Payer: Self-pay | Admitting: Nurse Practitioner

## 2020-07-05 VITALS — BP 132/94 | HR 80 | Temp 98.4°F | Resp 16 | Ht 74.0 in | Wt 239.0 lb

## 2020-07-05 DIAGNOSIS — R42 Dizziness and giddiness: Secondary | ICD-10-CM

## 2020-07-05 DIAGNOSIS — Z596 Low income: Secondary | ICD-10-CM | POA: Diagnosis not present

## 2020-07-05 DIAGNOSIS — E781 Pure hyperglyceridemia: Secondary | ICD-10-CM

## 2020-07-05 DIAGNOSIS — Z139 Encounter for screening, unspecified: Secondary | ICD-10-CM | POA: Diagnosis not present

## 2020-07-05 DIAGNOSIS — E785 Hyperlipidemia, unspecified: Secondary | ICD-10-CM | POA: Insufficient documentation

## 2020-07-05 MED ORDER — MECLIZINE HCL 25 MG PO TABS: 25.0000 mg | ORAL_TABLET | Freq: Three times a day (TID) | ORAL | 0 refills | Status: DC | PRN
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
## 2020-07-05 NOTE — Assessment & Plan Note (Signed)
-  positive Dix-Hallpike today -Rx. Meclizine -has hx of CVA, but this dizziness is acute x 4 days -previous imaging on 06/25/20 was negative

## 2020-07-05 NOTE — Patient Instructions (Signed)
For your dizziness, we started you on meclizine. If there is no improvement in about a week or symptoms get worse, return to the clinic.  For high triglycerides, you were started on Lovaza and fenofibrate. We will need to recheck your blood work in 3 months to make sure that your triglycerides are improving. Please get fasting labs 2-3 days prior to your visit.

## 2020-07-05 NOTE — Assessment & Plan Note (Signed)
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

## 2020-07-05 NOTE — Assessment & Plan Note (Signed)
-  trigs are significantly elevated -he was started on Lovaza and fenofibrate -repeat labs in 3 months

## 2020-07-12 ENCOUNTER — Encounter: Payer: 59 | Admitting: Nurse Practitioner

## 2020-07-21 ENCOUNTER — Other Ambulatory Visit: Payer: Self-pay

## 2020-07-21 ENCOUNTER — Encounter: Payer: Self-pay | Admitting: Nurse Practitioner

## 2020-07-21 ENCOUNTER — Ambulatory Visit (INDEPENDENT_AMBULATORY_CARE_PROVIDER_SITE_OTHER): Payer: 59 | Admitting: Nurse Practitioner

## 2020-07-21 DIAGNOSIS — M545 Low back pain, unspecified: Secondary | ICD-10-CM | POA: Insufficient documentation

## 2020-07-21 DIAGNOSIS — M5441 Lumbago with sciatica, right side: Secondary | ICD-10-CM

## 2020-07-21 DIAGNOSIS — R42 Dizziness and giddiness: Secondary | ICD-10-CM

## 2020-07-21 DIAGNOSIS — M79671 Pain in right foot: Secondary | ICD-10-CM

## 2020-07-21 DIAGNOSIS — I10 Essential (primary) hypertension: Secondary | ICD-10-CM | POA: Diagnosis not present

## 2020-07-21 DIAGNOSIS — M5442 Lumbago with sciatica, left side: Secondary | ICD-10-CM | POA: Diagnosis not present

## 2020-07-21 DIAGNOSIS — M79672 Pain in left foot: Secondary | ICD-10-CM

## 2020-07-21 DIAGNOSIS — G8929 Other chronic pain: Secondary | ICD-10-CM | POA: Insufficient documentation

## 2020-07-21 MED ORDER — IBUPROFEN 800 MG PO TABS: 800.0000 mg | ORAL_TABLET | Freq: Three times a day (TID) | ORAL | 0 refills | Status: DC | PRN

## 2020-07-21 MED ORDER — TIZANIDINE HCL 4 MG PO TABS: 4.0000 mg | ORAL_TABLET | Freq: Four times a day (QID) | ORAL | 0 refills | Status: DC | PRN

## 2020-07-21 MED ORDER — KETOROLAC TROMETHAMINE 60 MG/2ML IM SOLN
60.0000 mg | Freq: Once | INTRAMUSCULAR | Status: AC
  Administered 2020-07-21: 60 mg via INTRAMUSCULAR

## 2020-07-21 MED ORDER — PREDNISONE 10 MG PO TABS: ORAL_TABLET | ORAL | 0 refills | Status: AC
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
## 2020-07-21 NOTE — Assessment & Plan Note (Signed)
-  he states meclizine works for vertigo, but it is making him sleepy -we discussed taking half of a tablet when he has to work to reduce the drowsiness.

## 2020-07-21 NOTE — Assessment & Plan Note (Signed)
BP well controlled. Continue losartan  ?

## 2020-07-21 NOTE — Addendum Note (Signed)
Addended by: Jerilynn Mages on: 07/21/2020 04:34 PM   Modules accepted: Orders
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
## 2020-07-21 NOTE — Assessment & Plan Note (Addendum)
-  today right is worse than left -he was treated with stretches for plantar fasciitis, but he states his pain has not improved -may be come element of compensating for back pain -refer to ortho

## 2020-07-21 NOTE — Patient Instructions (Addendum)
It was great seeing you again.  For your back pain, we gave you a shot of pain medication, and I prescribed some ibuprofen, prednisone, and tizanidine, a muscle relaxer.  I also sent in a referral to the orthopedic doc.  Hopefully, you will feel better soon.  As far as labs go, let's check them again in 3 months for high triglycerides

## 2020-07-28 ENCOUNTER — Other Ambulatory Visit: Payer: Self-pay | Admitting: Orthopaedic Surgery

## 2020-07-28 ENCOUNTER — Encounter: Payer: Self-pay | Admitting: Orthopaedic Surgery

## 2020-07-28 ENCOUNTER — Ambulatory Visit: Payer: 59

## 2020-07-28 ENCOUNTER — Other Ambulatory Visit: Payer: Self-pay

## 2020-07-28 ENCOUNTER — Ambulatory Visit (INDEPENDENT_AMBULATORY_CARE_PROVIDER_SITE_OTHER): Payer: 59 | Admitting: Orthopaedic Surgery

## 2020-07-28 VITALS — BP 155/93 | HR 83 | Ht 74.0 in | Wt 239.0 lb

## 2020-07-28 DIAGNOSIS — M545 Low back pain, unspecified: Secondary | ICD-10-CM

## 2020-07-28 DIAGNOSIS — G8929 Other chronic pain: Secondary | ICD-10-CM

## 2020-07-28 MED ORDER — NAPROXEN 500 MG PO TABS: 500.0000 mg | ORAL_TABLET | Freq: Two times a day (BID) | ORAL | 5 refills | Status: DC

## 2020-07-28 MED ORDER — HYDROCODONE-ACETAMINOPHEN 5-325 MG PO TABS: ORAL_TABLET | ORAL | 0 refills | Status: DC

## 2020-07-28 MED ORDER — PREDNISONE 5 MG (21) PO TBPK: ORAL_TABLET | ORAL | 0 refills | Status: DC

## 2020-07-28 MED ORDER — CYCLOBENZAPRINE HCL 10 MG PO TABS: 10.0000 mg | ORAL_TABLET | Freq: Every day | ORAL | 0 refills | Status: DC
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
## 2020-08-09 ENCOUNTER — Telehealth: Payer: Self-pay | Admitting: Orthopaedic Surgery

## 2020-08-09 NOTE — Telephone Encounter (Signed)
Patient and his wife called stating the pain medicine you called in is not working for him he needs something stronger.  Patient wife is having to help him go to the bathroom and come back to bed.    Please call [phone]  Pharmacy :   [facility]

## 2020-08-10 MED ORDER — OXYCODONE-ACETAMINOPHEN 5-325 MG PO TABS: ORAL_TABLET | ORAL | 0 refills | Status: DC

## 2020-08-10 NOTE — Telephone Encounter (Signed)
You gave him Hydrocodone-Acetaminophen 5/325mg .    [facility]

## 2020-08-11 ENCOUNTER — Other Ambulatory Visit: Payer: Self-pay | Admitting: Orthopaedic Surgery

## 2020-08-11 ENCOUNTER — Ambulatory Visit: Payer: 59 | Admitting: Orthopaedic Surgery

## 2020-08-11 DIAGNOSIS — G8929 Other chronic pain: Secondary | ICD-10-CM

## 2020-08-11 DIAGNOSIS — M545 Low back pain, unspecified: Secondary | ICD-10-CM

## 2020-08-16 ENCOUNTER — Telehealth: Payer: Self-pay | Admitting: Orthopaedic Surgery

## 2020-08-24 ENCOUNTER — Telehealth: Payer: Self-pay | Admitting: Orthopaedic Surgery

## 2020-08-24 NOTE — Telephone Encounter (Signed)
Rx request 

## 2020-08-30 ENCOUNTER — Other Ambulatory Visit: Payer: Self-pay

## 2020-08-30 ENCOUNTER — Ambulatory Visit
Admission: RE | Admit: 2020-08-30 | Discharge: 2020-08-30 | Disposition: A | Discharge status: Home / Self Care | Payer: 59 | Source: Ambulatory Visit | Attending: Orthopaedic Surgery | Admitting: Orthopaedic Surgery

## 2020-08-30 DIAGNOSIS — G8929 Other chronic pain: Secondary | ICD-10-CM

## 2020-08-30 IMAGING — MR MR LUMBAR SPINE W/O CM
4 of 5 series · 26 of 48 positions shown · non-contrast
Comparison: Lumbar radiographs [DATE].

CLINICAL DATA: 54-year-old male with midline low back pain. Severe
low back pain with right side sciatica.

EXAM:
MRI LUMBAR SPINE WITHOUT CONTRAST
TECHNIQUE: Multiplanar, multisequence MR imaging of the lumbar spine was
performed. No intravenous contrast was administered.

[Series 3: T2 · sagittal · 4.0mm · 0.53mm/px · 5 of 16 slices shown (1 of 2)]
[im 1/16]
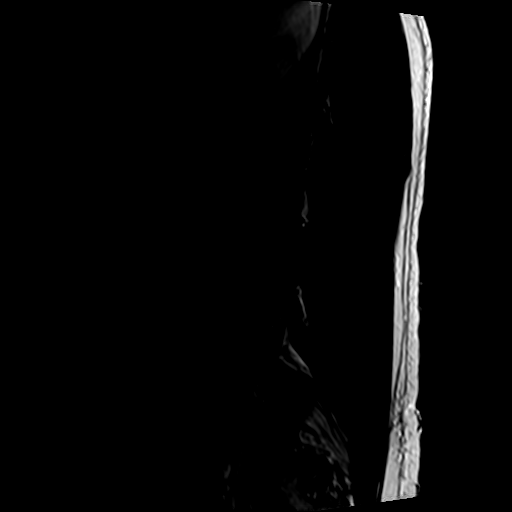
[im 4/16]
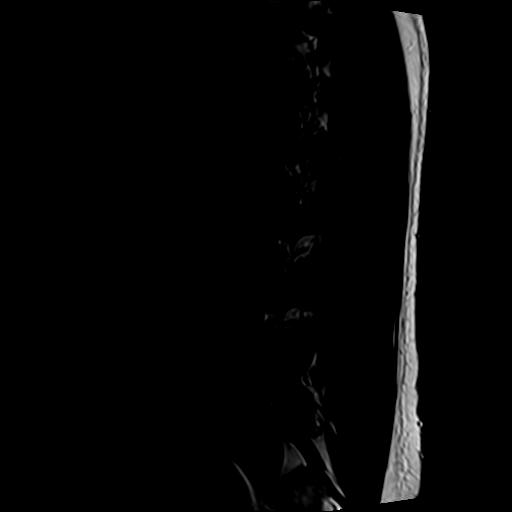
[im 8/16]
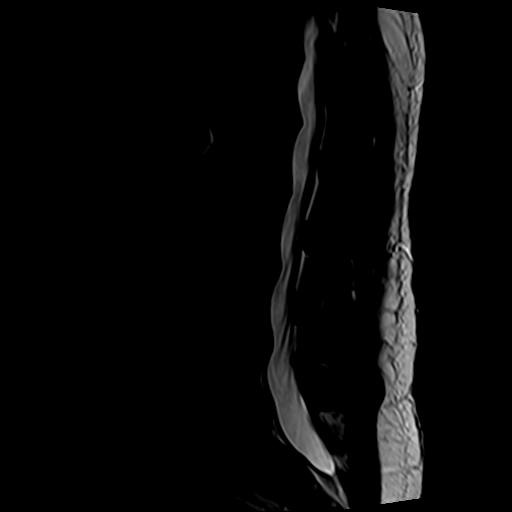
[im 12/16]
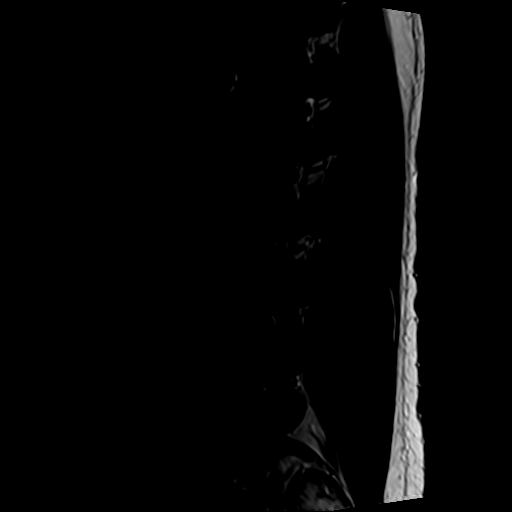
[im 16/16]
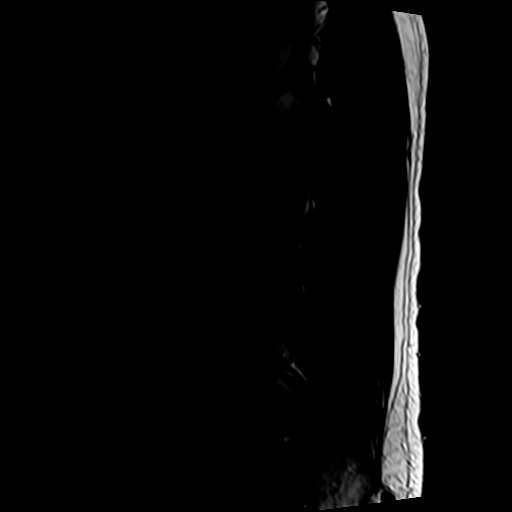

[Series 5: T1 · sagittal · 4.0mm · 0.53mm/px · 6 of 16 slices shown (1 of 2)]
[im 1/16]
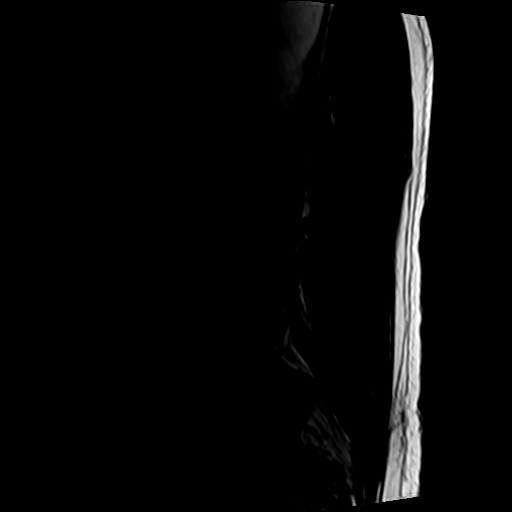
[im 4/16]
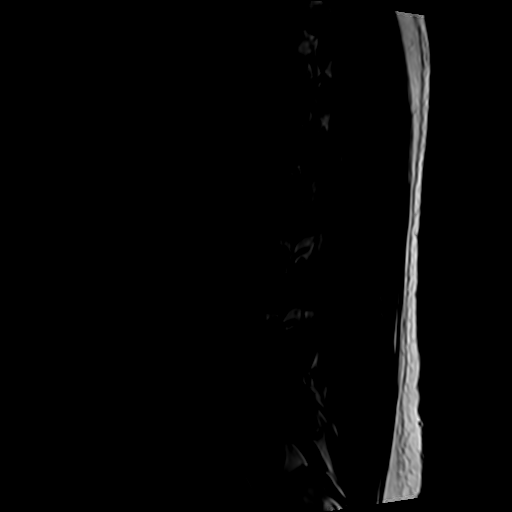
[im 7/16]
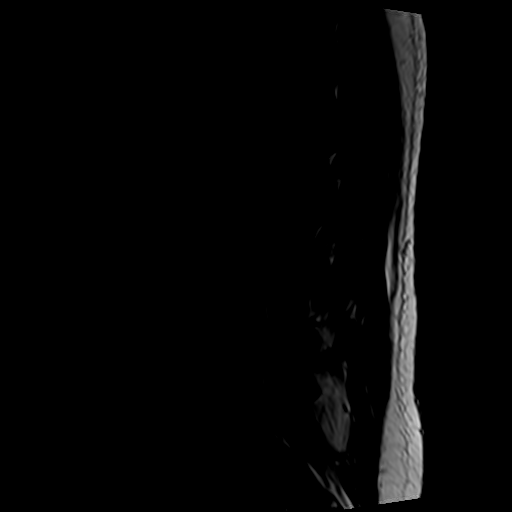
[im 10/16]
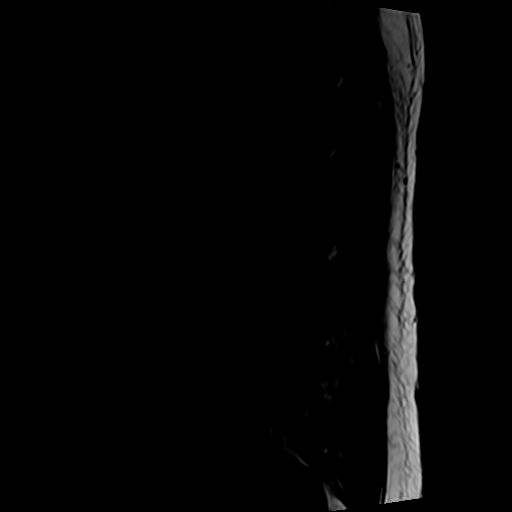
[im 13/16]
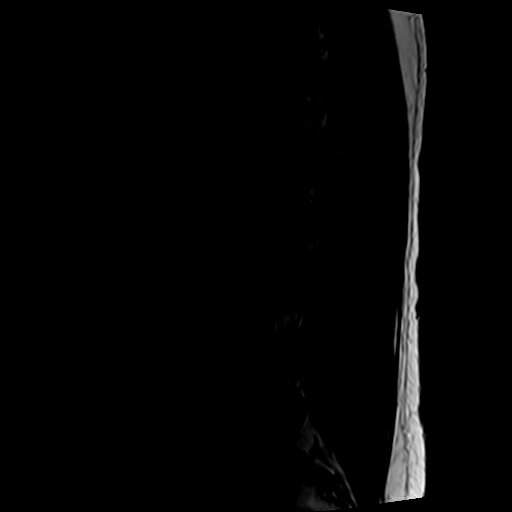
[im 16/16]
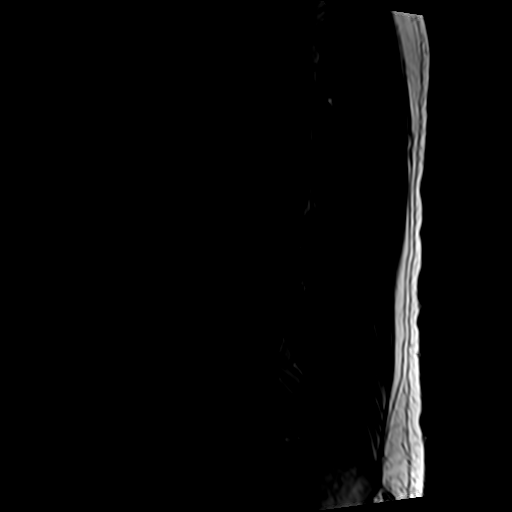

[Series 6: T2 · axial · 4.0mm · 0.70mm/px · z∈[-179,+43]mm · 10 of 44 slices shown (2 of 2)]
[im 3/44]
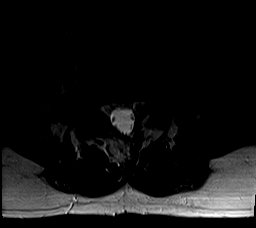
[im 6/44]
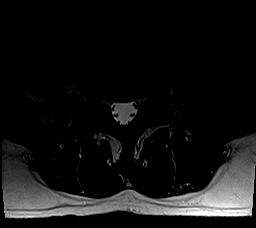
[im 9/44]
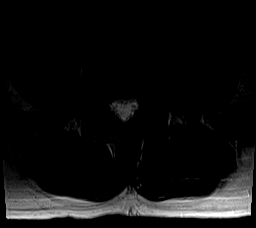
[im 15/44]
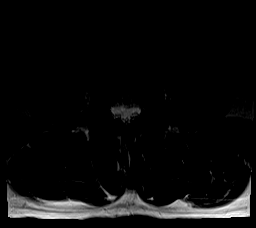
[im 21/44]
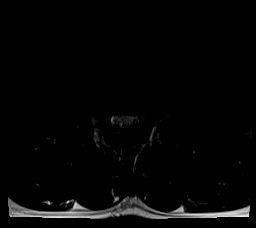
[im 23/44]
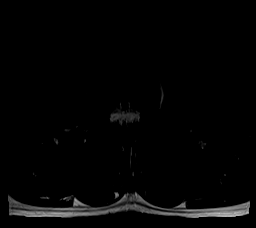
[im 26/44]
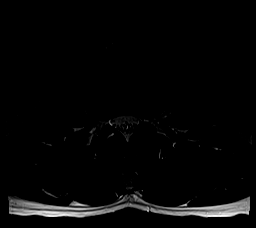
[im 32/44]
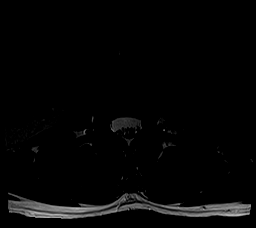
[im 38/44]
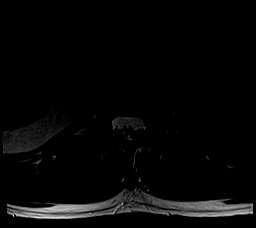
[im 44/44]
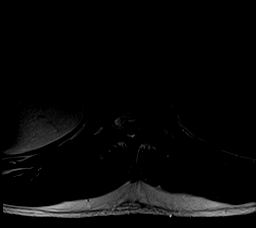

[Series 7: T1 · axial · 4.0mm · 0.35mm/px · z∈[-179,+12]mm · 5 of 44 slices shown (2 of 2)]
[im 3/44]
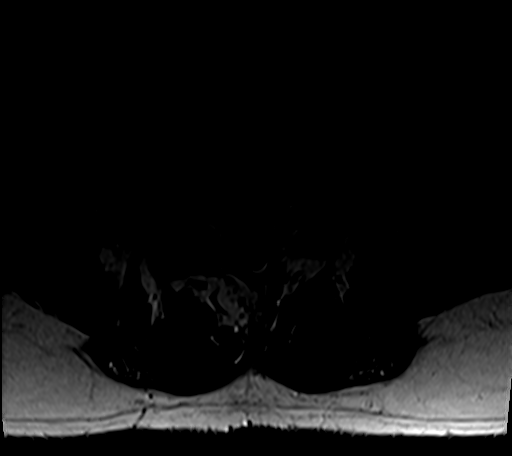
[im 6/44]
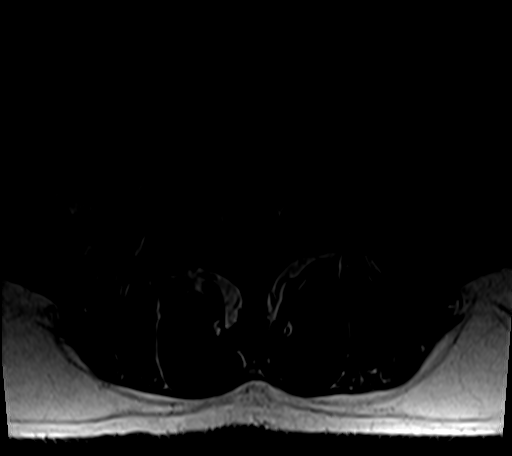
[im 9/44]
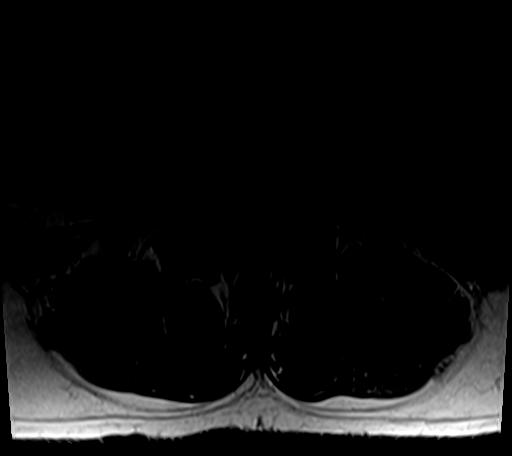
[im 23/44]
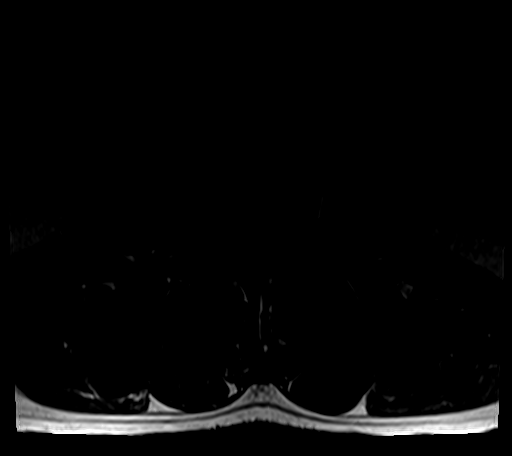
[im 38/44]
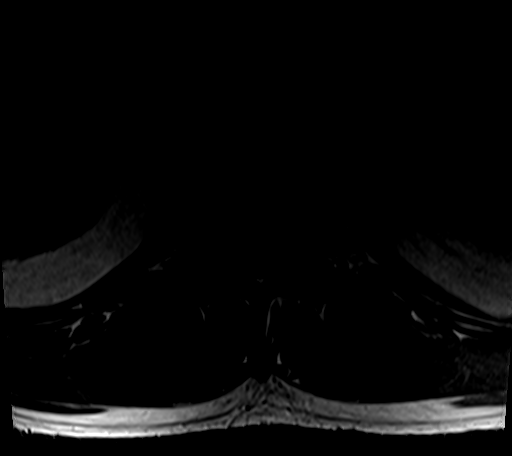

[26 of 48 positions shown; findings below may reference images not displayed]

FINDINGS: Segmentation:  Normal on the comparison radiographs.

Alignment: Increase straightening of lumbar lordosis compared to the
radiographs last month. No spondylolisthesis.

Vertebrae: Minimal degenerative endplate marrow edema inferiorly at
L4, anteriorly superiorly at S1 (series 4, image 8). The former is
associated with a small Schmorl's node (series 5, image 7).
Background bone marrow signal is within normal limits. Spina bifida
occulta at S1 (normal variant.

Conus medullaris and cauda equina: Conus extends to the L1 level. No
lower spinal cord or conus signal abnormality.

Paraspinal and other soft tissues: Negative.

Disc levels:

T12-L1:  Subtle disc bulging eccentric to the left.  No stenosis.

L1-L2: Mild disc space loss and circumferential disc bulging. No
stenosis.

L2-L3:  Mild disc desiccation.  Minimal disc bulging.  No stenosis.

L3-L4: Mild disc desiccation and circumferential disc bulge.
Broad-based foraminal disc bilaterally. Mild posterior element
hypertrophy. Borderline spinal stenosis. Mild to moderate bilateral
L3 neural foraminal stenosis, greater on the left.

L4-L5: Disc desiccation. Mild circumferential disc bulge. Mild facet
hypertrophy. No spinal or lateral recess stenosis. Mild to moderate
L4 foraminal stenosis also greater on the left.

L5-S1: Mild disc bulging and endplate spurring. Mild facet
hypertrophy greater on the right. No spinal or lateral recess
stenosis. Mild to moderate bilateral L5 foraminal stenosis which
does appear greater on the right (series 3, image 4).
IMPRESSION: 1. Lumbar neural foraminal stenosis associated with disc bulging and
facet hypertrophy. Maximal right side neural foraminal stenosis is
at L5-S1. Query right L5 radiculitis.
2. No significant spinal, lateral recess stenosis, or other
convincing neural impingement.
3. Small degenerative L4 inferior endplate Schmorl's node with faint
marrow edema.

## 2020-08-31 ENCOUNTER — Telehealth: Payer: Self-pay | Admitting: Orthopaedic Surgery

## 2020-09-06 ENCOUNTER — Encounter: Payer: Self-pay | Admitting: Orthopaedic Surgery

## 2020-09-06 ENCOUNTER — Other Ambulatory Visit: Payer: Self-pay

## 2020-09-06 ENCOUNTER — Ambulatory Visit (INDEPENDENT_AMBULATORY_CARE_PROVIDER_SITE_OTHER): Payer: 59 | Admitting: Orthopaedic Surgery

## 2020-09-06 VITALS — BP 133/68 | HR 70 | Ht 74.0 in | Wt 239.0 lb

## 2020-09-06 DIAGNOSIS — M545 Low back pain, unspecified: Secondary | ICD-10-CM

## 2020-09-06 DIAGNOSIS — G8929 Other chronic pain: Secondary | ICD-10-CM

## 2020-09-06 MED ORDER — OXYCODONE-ACETAMINOPHEN 5-325 MG PO TABS: ORAL_TABLET | ORAL | 0 refills | Status: DC
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
## 2020-09-06 NOTE — Patient Instructions (Signed)
[facility]  ADDRESS [address] [place], [place] [address] Phone: [phone]

## 2020-09-07 ENCOUNTER — Telehealth: Payer: Self-pay | Admitting: Orthopaedic Surgery

## 2020-09-07 NOTE — Telephone Encounter (Signed)
Patient called - relays that [facility] cannot provide him with the cane as prescribed due to insurance. States they advised him to call the number on back of insurance card. Patient has [employer] coverage. Please advise if any local options for a cane that may accept this insurance, or would patient need to purchase as self pay?

## 2020-09-08 NOTE — Telephone Encounter (Signed)
Patient called back to relay to Kathie Rhodes that he has gotten the matter taken care of with the prescription for the cane; said we do not need to do anything further.

## 2020-09-14 ENCOUNTER — Telehealth: Payer: Self-pay | Admitting: Radiology

## 2020-09-14 ENCOUNTER — Other Ambulatory Visit: Payer: Self-pay | Admitting: Orthopaedic Surgery

## 2020-09-14 DIAGNOSIS — M545 Low back pain, unspecified: Secondary | ICD-10-CM

## 2020-09-14 DIAGNOSIS — G8929 Other chronic pain: Secondary | ICD-10-CM

## 2020-09-14 MED ORDER — OXYCODONE-ACETAMINOPHEN 5-325 MG PO TABS: ORAL_TABLET | ORAL | 0 refills | Status: DC

## 2020-09-14 NOTE — Telephone Encounter (Signed)
Patient called to follow up on referral to doctor in [place].  Per your last note patient requested referral to [facility].  Referral was not entered in chart. I am entering and processing [facility] referral today.    **Patient also asks for a refill of oxycodone.  [facility].

## 2020-09-19 ENCOUNTER — Other Ambulatory Visit: Payer: Self-pay | Admitting: Orthopaedic Surgery

## 2020-09-19 NOTE — Telephone Encounter (Signed)
Too early.  Needs to wait until 09/21/20

## 2020-09-21 ENCOUNTER — Telehealth: Payer: Self-pay | Admitting: Orthopaedic Surgery

## 2020-09-21 NOTE — Telephone Encounter (Signed)
Patient requests prescription for Oxycodone 5-325 mgs.  Qty  26                          Sig: One tablet for pain every four hours.   Patient uses [facility]

## 2020-09-22 MED ORDER — OXYCODONE-ACETAMINOPHEN 5-325 MG PO TABS
ORAL_TABLET | ORAL | 0 refills | Status: DC
Start: 2020-09-22 — End: 2020-09-28

## 2020-09-27 ENCOUNTER — Telehealth: Payer: Self-pay

## 2020-09-27 NOTE — Telephone Encounter (Signed)
Oxycodone-Acetaminophen  5/325mg   Qty 20 Tablets   PATIENT USES [facility]

## 2020-09-28 MED ORDER — OXYCODONE-ACETAMINOPHEN 5-325 MG PO TABS: ORAL_TABLET | ORAL | 0 refills | Status: DC

## 2020-10-03 ENCOUNTER — Encounter: Payer: Self-pay | Admitting: Nurse Practitioner

## 2020-10-03 ENCOUNTER — Other Ambulatory Visit: Payer: Self-pay

## 2020-10-03 ENCOUNTER — Ambulatory Visit (INDEPENDENT_AMBULATORY_CARE_PROVIDER_SITE_OTHER): Payer: 59 | Admitting: Nurse Practitioner

## 2020-10-03 VITALS — BP 151/80 | HR 92 | Temp 98.5°F | Resp 20 | Ht 74.0 in | Wt 244.0 lb

## 2020-10-03 DIAGNOSIS — M5442 Lumbago with sciatica, left side: Secondary | ICD-10-CM

## 2020-10-03 DIAGNOSIS — R0683 Snoring: Secondary | ICD-10-CM | POA: Insufficient documentation

## 2020-10-03 DIAGNOSIS — E781 Pure hyperglyceridemia: Secondary | ICD-10-CM

## 2020-10-03 DIAGNOSIS — R42 Dizziness and giddiness: Secondary | ICD-10-CM

## 2020-10-03 DIAGNOSIS — M5441 Lumbago with sciatica, right side: Secondary | ICD-10-CM

## 2020-10-03 DIAGNOSIS — I10 Essential (primary) hypertension: Secondary | ICD-10-CM

## 2020-10-03 MED ORDER — OMEGA-3-ACID ETHYL ESTERS 1 G PO CAPS: 2.0000 g | ORAL_CAPSULE | Freq: Two times a day (BID) | ORAL | 1 refills | Status: DC

## 2020-10-03 MED ORDER — CYCLOBENZAPRINE HCL 10 MG PO TABS
10.0000 mg | ORAL_TABLET | Freq: Every day | ORAL | 0 refills | Status: DC
Start: 2020-10-03 — End: 2021-06-27

## 2020-10-03 MED ORDER — FENOFIBRATE 145 MG PO TABS: 145.0000 mg | ORAL_TABLET | Freq: Every day | ORAL | 3 refills | Status: AC

## 2020-10-03 MED ORDER — LOSARTAN POTASSIUM 50 MG PO TABS: 50.0000 mg | ORAL_TABLET | Freq: Every day | ORAL | 3 refills | Status: AC

## 2020-10-03 MED ORDER — MECLIZINE HCL 25 MG PO TABS: 25.0000 mg | ORAL_TABLET | Freq: Three times a day (TID) | ORAL | 1 refills | Status: AC | PRN
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
## 2020-10-03 NOTE — Assessment & Plan Note (Signed)
-  followed by Dr. Hilda Lias -he states he has appt in [place] tomorrow for back injections -refilled flexeril; he states he isn't taking tizanidine, but may be confused about his muscle relaxers

## 2020-10-03 NOTE — Assessment & Plan Note (Signed)
-  ordered fasting labs, but he hasn't fasted today -plans to come back Wednesday 4/13 for labs

## 2020-10-03 NOTE — Patient Instructions (Signed)
He will need fasting labs drawn on Wednesday.  For the appointment in 3 months, please have fasting labs drawn 2-3 days prior to your appointment so we can discuss the results during your office visit.

## 2020-10-03 NOTE — Assessment & Plan Note (Signed)
-  BP elevated today -he states that he has been snoring and not breathing while asleep -home sleep study ordered

## 2020-10-03 NOTE — Assessment & Plan Note (Signed)
-  with snoring and stopping breathing at night, will get him a home sleep study

## 2020-10-03 NOTE — Assessment & Plan Note (Signed)
-  refilled meclizine

## 2020-10-04 ENCOUNTER — Ambulatory Visit: Payer: 59 | Admitting: Orthopaedic Surgery

## 2020-10-06 ENCOUNTER — Other Ambulatory Visit: Payer: Self-pay | Admitting: Nurse Practitioner

## 2020-10-06 LAB — CBC WITH DIFFERENTIAL/PLATELET
Basophils Absolute: 0 10*3/uL (ref 0.0–0.2)
Basos: 0 %
EOS (ABSOLUTE): 0 10*3/uL (ref 0.0–0.4)
Eos: 0 %
Hematocrit: 44.1 % (ref 37.5–51.0)
Hemoglobin: 14.8 g/dL (ref 13.0–17.7)
Immature Grans (Abs): 0 10*3/uL (ref 0.0–0.1)
Immature Granulocytes: 0 %
Lymphocytes Absolute: 1.6 10*3/uL (ref 0.7–3.1)
Lymphs: 15 %
MCH: 30.8 pg (ref 26.6–33.0)
MCHC: 33.6 g/dL (ref 31.5–35.7)
MCV: 92 fL (ref 79–97)
Monocytes Absolute: 0.9 10*3/uL (ref 0.1–0.9)
Monocytes: 9 %
Neutrophils Absolute: 8.1 10*3/uL — ABNORMAL HIGH (ref 1.4–7.0)
Neutrophils: 76 %
Platelets: 253 10*3/uL (ref 150–450)
RBC: 4.8 x10E6/uL (ref 4.14–5.80)
RDW: 13.8 % (ref 11.6–15.4)
WBC: 10.6 10*3/uL (ref 3.4–10.8)

## 2020-10-06 LAB — LIPID PANEL WITH LDL/HDL RATIO
Cholesterol, Total: 250 mg/dL — ABNORMAL HIGH (ref 100–199)
HDL: 67 mg/dL (ref >39)
LDL Chol Calc (NIH): 170 mg/dL — ABNORMAL HIGH (ref 0–99)
LDL/HDL Ratio: 2.5 ratio (ref 0.0–3.6)
Triglycerides: 76 mg/dL (ref 0–149)
VLDL Cholesterol Cal: 13 mg/dL (ref 5–40)

## 2020-10-06 LAB — CMP14+EGFR
ALT: 34 IU/L (ref 0–44)
AST: 22 IU/L (ref 0–40)
Albumin/Globulin Ratio: 2 (ref 1.2–2.2)
Albumin: 4.5 g/dL (ref 3.8–4.9)
Alkaline Phosphatase: 70 IU/L (ref 44–121)
BUN/Creatinine Ratio: 17 (ref 9–20)
BUN: 18 mg/dL (ref 6–24)
Bilirubin Total: 0.3 mg/dL (ref 0.0–1.2)
CO2: 20 mmol/L (ref 20–29)
Calcium: 10 mg/dL (ref 8.7–10.2)
Chloride: 109 mmol/L — ABNORMAL HIGH (ref 96–106)
Creatinine, Ser: 1.07 mg/dL (ref 0.76–1.27)
Globulin, Total: 2.2 g/dL (ref 1.5–4.5)
Glucose: 102 mg/dL — ABNORMAL HIGH (ref 65–99)
Potassium: 4.6 mmol/L (ref 3.5–5.2)
Sodium: 145 mmol/L — ABNORMAL HIGH (ref 134–144)
Total Protein: 6.7 g/dL (ref 6.0–8.5)
eGFR: 83 mL/min/{1.73_m2} (ref >59)

## 2020-10-06 MED ORDER — ATORVASTATIN CALCIUM 20 MG PO TABS: 20.0000 mg | ORAL_TABLET | Freq: Every day | ORAL | 3 refills | Status: DC

## 2020-10-06 NOTE — Progress Notes (Signed)
Lab Results  Component Value Date   CHOL 250 (H) 10/05/2020   HDL 67 10/05/2020   LDLCALC 170 (H) 10/05/2020   TRIG 76 10/05/2020  -Rx. atorvastatin

## 2020-10-06 NOTE — Progress Notes (Signed)
His triglycerides have improved and are back in the normal range, but his bad cholesterol has increased some. I sent in a cholesterol medicine to bring that down.

## 2020-10-11 ENCOUNTER — Encounter: Payer: Self-pay | Admitting: Orthopaedic Surgery

## 2020-10-11 ENCOUNTER — Other Ambulatory Visit: Payer: Self-pay

## 2020-10-11 ENCOUNTER — Ambulatory Visit (INDEPENDENT_AMBULATORY_CARE_PROVIDER_SITE_OTHER): Payer: 59 | Admitting: Orthopaedic Surgery

## 2020-10-11 VITALS — BP 145/84 | HR 87 | Ht 74.0 in | Wt 246.0 lb

## 2020-10-11 DIAGNOSIS — M545 Low back pain, unspecified: Secondary | ICD-10-CM

## 2020-10-11 DIAGNOSIS — G8929 Other chronic pain: Secondary | ICD-10-CM | POA: Diagnosis not present

## 2020-10-11 MED ORDER — OXYCODONE-ACETAMINOPHEN 5-325 MG PO TABS: ORAL_TABLET | ORAL | 0 refills | Status: DC

## 2020-10-11 NOTE — Patient Instructions (Signed)
We will send information today to Pain clinic, you can call them today to make the appointment
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
## 2020-10-14 ENCOUNTER — Ambulatory Visit: Payer: 59 | Admitting: Nurse Practitioner

## 2020-10-28 ENCOUNTER — Ambulatory Visit (INDEPENDENT_AMBULATORY_CARE_PROVIDER_SITE_OTHER): Payer: 59 | Admitting: Nurse Practitioner

## 2020-10-28 ENCOUNTER — Encounter: Payer: Self-pay | Admitting: Nurse Practitioner

## 2020-10-28 ENCOUNTER — Other Ambulatory Visit: Payer: Self-pay

## 2020-10-28 VITALS — BP 140/86 | HR 80 | Temp 98.5°F | Resp 20 | Ht 74.0 in | Wt 250.0 lb

## 2020-10-28 DIAGNOSIS — R21 Rash and other nonspecific skin eruption: Secondary | ICD-10-CM

## 2020-10-28 DIAGNOSIS — S61011A Laceration without foreign body of right thumb without damage to nail, initial encounter: Secondary | ICD-10-CM | POA: Diagnosis not present

## 2020-10-28 DIAGNOSIS — R0683 Snoring: Secondary | ICD-10-CM | POA: Diagnosis not present

## 2020-10-28 MED ORDER — TRIAMCINOLONE ACETONIDE 0.1 % EX CREA
1.0000 | TOPICAL_CREAM | Freq: Two times a day (BID) | CUTANEOUS | 0 refills | Status: DC
Start: 2020-10-28 — End: 2021-06-27

## 2020-10-28 MED ORDER — PREDNISONE 10 MG PO TABS: ORAL_TABLET | ORAL | 0 refills | Status: AC

## 2020-10-28 NOTE — Assessment & Plan Note (Signed)
-  hit his thumb with dumbbell that he was using as a sledge hammer to drive in a fence post -TDaP today -has a large scab and used super glue to seal it -no sign of infection today, but we discussed signs of infection and he will return to clinic if wound reds red, purulent, or malodorous.
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
## 2020-10-28 NOTE — Assessment & Plan Note (Signed)
-  he hasn't heard to set up sleep study -resent referral today

## 2020-10-28 NOTE — Assessment & Plan Note (Signed)
-  contact dermatitis vs chiggers -Rx. Prednisone  -Rx. Triamcinolone cream for hot spots

## 2020-11-10 ENCOUNTER — Telehealth: Payer: Self-pay

## 2020-11-10 NOTE — Telephone Encounter (Signed)
Pt is calling in he has not received anything regarding his sleep Study.. Please advise

## 2020-11-14 NOTE — Telephone Encounter (Signed)
What is the status on this?

## 2020-11-15 NOTE — Telephone Encounter (Signed)
I have messaged Richard Wagner ([email or web address]) to check on status.

## 2020-11-16 ENCOUNTER — Telehealth: Payer: Self-pay

## 2020-11-16 ENCOUNTER — Other Ambulatory Visit: Payer: Self-pay

## 2020-11-16 DIAGNOSIS — R0683 Snoring: Secondary | ICD-10-CM

## 2020-11-16 MED ORDER — UNABLE TO FIND: 0 refills | Status: DC

## 2020-11-16 NOTE — Telephone Encounter (Signed)
Pt has not heard anything from the sleep apnea machine

## 2020-11-16 NOTE — Telephone Encounter (Signed)
I called [employer] and they said they have not received the orders. New orders are going to be faxed today. Fax # [phone]

## 2020-11-22 NOTE — Telephone Encounter (Signed)
Pt is calling for an update --I advised to give til Thursday

## 2020-11-29 ENCOUNTER — Encounter: Payer: Self-pay | Admitting: Nurse Practitioner

## 2020-11-29 ENCOUNTER — Ambulatory Visit (INDEPENDENT_AMBULATORY_CARE_PROVIDER_SITE_OTHER): Payer: 59 | Admitting: Nurse Practitioner

## 2020-11-29 ENCOUNTER — Other Ambulatory Visit: Payer: Self-pay

## 2020-11-29 VITALS — BP 142/80 | HR 94 | Temp 98.8°F | Resp 18 | Ht 74.0 in | Wt 249.0 lb

## 2020-11-29 DIAGNOSIS — Z596 Low income: Secondary | ICD-10-CM | POA: Diagnosis not present

## 2020-11-29 DIAGNOSIS — I10 Essential (primary) hypertension: Secondary | ICD-10-CM

## 2020-11-29 DIAGNOSIS — R42 Dizziness and giddiness: Secondary | ICD-10-CM | POA: Diagnosis not present

## 2020-11-29 DIAGNOSIS — E781 Pure hyperglyceridemia: Secondary | ICD-10-CM | POA: Diagnosis not present
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
## 2020-11-29 NOTE — Assessment & Plan Note (Signed)
BP Readings from Last 3 Encounters:  11/29/20 139/89  10/28/20 140/86  10/11/20 (!) 145/84  -BP well controlled today

## 2020-11-29 NOTE — Assessment & Plan Note (Addendum)
-  we refilled meclizine on 10/03/20 -negative for orthostatic hypotension today -referral to neuro since he has hx of CVA, want to r/o vertigo of central origin -referral to PT for vestibular rehab since meclizine isn't working

## 2020-11-29 NOTE — Patient Instructions (Addendum)
Please have fasting labs drawn 2-3 days prior to your appointment so we can discuss the results during your office visit.  

## 2020-11-29 NOTE — Assessment & Plan Note (Signed)
-  he was unable to get sleep study d/t costs -referral to community care coordinator

## 2020-12-06 ENCOUNTER — Telehealth: Payer: Self-pay | Admitting: Nurse Practitioner

## 2020-12-06 NOTE — Telephone Encounter (Signed)
   Telephone encounter was:  Successful.  12/06/2020 Name: Bleu Minerd MRN: [id number] DOB: 12-15-1965  Ji Feldner is a 55 y.o. year old male who is a primary care patient of Heather Roberts, NP . The community resource team was consulted for assistance with Financial Difficulties related to not having any state resources  Care guide performed the following interventions: Patient provided with information about care guide support team and interviewed to confirm resource needs Discussed resources to assist with applying for Medicaid, food stamps, disability and LIE[place] programs. Patient has no income at all coming in, wife brings in from diability around $989 a month. Pt has applied for disability and went last week for the doctor assessment to see if he qualifies for disability. They state that they have applied for food stamps and Medicaid before and were turned down, however they are in the income range for getting aid. I suggested that they go directly to the DSS office and ask for a case manager. Filling out the forms and mailing them in takes a long time and they need help now. Also gave them information on applying through their energy company [employer] for the [employer]. No other needs at this time and no other resources to suggest out of [employer] .  Follow Up Plan:  No further follow up planned at this time. The patient has been provided with needed resources.  Rojelio Brenner Care Guide, Embedded Care Coordination [facility], Care Management Phone: [phone] Email: [email or web address]

## 2021-01-02 ENCOUNTER — Ambulatory Visit: Payer: 59 | Admitting: Nurse Practitioner

## 2021-01-30 ENCOUNTER — Encounter: Payer: Self-pay | Admitting: Nurse Practitioner

## 2021-01-30 ENCOUNTER — Other Ambulatory Visit: Payer: Self-pay

## 2021-01-30 ENCOUNTER — Ambulatory Visit (INDEPENDENT_AMBULATORY_CARE_PROVIDER_SITE_OTHER): Payer: 59 | Admitting: Nurse Practitioner

## 2021-01-30 VITALS — BP 130/78 | HR 72 | Temp 97.4°F | Ht 74.0 in | Wt 243.0 lb

## 2021-01-30 DIAGNOSIS — G8929 Other chronic pain: Secondary | ICD-10-CM

## 2021-01-30 DIAGNOSIS — I10 Essential (primary) hypertension: Secondary | ICD-10-CM | POA: Diagnosis not present

## 2021-01-30 DIAGNOSIS — E781 Pure hyperglyceridemia: Secondary | ICD-10-CM | POA: Diagnosis not present

## 2021-01-30 DIAGNOSIS — M5442 Lumbago with sciatica, left side: Secondary | ICD-10-CM

## 2021-01-30 DIAGNOSIS — M5441 Lumbago with sciatica, right side: Secondary | ICD-10-CM

## 2021-01-30 MED ORDER — PRAVASTATIN SODIUM 40 MG PO TABS: 40.0000 mg | ORAL_TABLET | Freq: Every day | ORAL | 1 refills | Status: DC

## 2021-01-30 NOTE — Assessment & Plan Note (Signed)
-  followed by pain clinic -records requested

## 2021-01-30 NOTE — Assessment & Plan Note (Signed)
-  Labs ordered -STOP atorvastatin d/t fatigue -Rx. pravastatin

## 2021-01-30 NOTE — Patient Instructions (Signed)
Please have fasting labs drawn this week. 

## 2021-01-30 NOTE — Assessment & Plan Note (Signed)
BP Readings from Last 3 Encounters:  01/30/21 130/78  11/29/20 (!) 142/80  10/28/20 140/86   -well controlled
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
## 2021-03-10 ENCOUNTER — Ambulatory Visit: Payer: 59 | Admitting: Diagnostic Neuroimaging

## 2021-03-30 ENCOUNTER — Other Ambulatory Visit: Payer: Self-pay

## 2021-03-30 ENCOUNTER — Ambulatory Visit (INDEPENDENT_AMBULATORY_CARE_PROVIDER_SITE_OTHER): Payer: 59 | Admitting: Pulmonary Disease

## 2021-03-30 ENCOUNTER — Encounter: Payer: Self-pay | Admitting: Pulmonary Disease

## 2021-03-30 VITALS — BP 124/70 | HR 74 | Temp 98.0°F | Ht 74.0 in | Wt 238.0 lb

## 2021-03-30 DIAGNOSIS — Z23 Encounter for immunization: Secondary | ICD-10-CM | POA: Diagnosis not present

## 2021-03-30 DIAGNOSIS — R0683 Snoring: Secondary | ICD-10-CM | POA: Diagnosis not present

## 2021-03-30 NOTE — Patient Instructions (Signed)
Will arrange for home sleep study Will call to arrange for follow up after sleep study reviewed  
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
## 2021-05-05 ENCOUNTER — Institutional Professional Consult (permissible substitution): Payer: 59 | Admitting: Pulmonary Disease

## 2021-06-08 ENCOUNTER — Other Ambulatory Visit: Payer: Self-pay

## 2021-06-08 ENCOUNTER — Ambulatory Visit: Payer: 59

## 2021-06-08 DIAGNOSIS — G4733 Obstructive sleep apnea (adult) (pediatric): Secondary | ICD-10-CM | POA: Diagnosis not present

## 2021-06-08 DIAGNOSIS — R0683 Snoring: Secondary | ICD-10-CM

## 2021-06-14 ENCOUNTER — Other Ambulatory Visit: Payer: Self-pay

## 2021-06-14 ENCOUNTER — Encounter: Payer: Self-pay | Admitting: Nurse Practitioner

## 2021-06-14 ENCOUNTER — Ambulatory Visit (INDEPENDENT_AMBULATORY_CARE_PROVIDER_SITE_OTHER): Payer: 59 | Admitting: Nurse Practitioner

## 2021-06-14 VITALS — BP 144/87 | HR 66 | Ht 74.0 in | Wt 242.0 lb

## 2021-06-14 DIAGNOSIS — Z0001 Encounter for general adult medical examination with abnormal findings: Secondary | ICD-10-CM

## 2021-06-14 DIAGNOSIS — Z8673 Personal history of transient ischemic attack (TIA), and cerebral infarction without residual deficits: Secondary | ICD-10-CM | POA: Diagnosis not present

## 2021-06-14 DIAGNOSIS — I10 Essential (primary) hypertension: Secondary | ICD-10-CM

## 2021-06-14 DIAGNOSIS — Z0000 Encounter for general adult medical examination without abnormal findings: Secondary | ICD-10-CM | POA: Insufficient documentation

## 2021-06-14 DIAGNOSIS — Z1211 Encounter for screening for malignant neoplasm of colon: Secondary | ICD-10-CM | POA: Diagnosis not present

## 2021-06-14 DIAGNOSIS — R079 Chest pain, unspecified: Secondary | ICD-10-CM | POA: Diagnosis not present

## 2021-06-14 MED ORDER — ASPIRIN 81 MG PO TBEC: 81.0000 mg | DELAYED_RELEASE_TABLET | Freq: Every day | ORAL | 12 refills | Status: AC
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
## 2021-06-14 NOTE — Assessment & Plan Note (Signed)
Annual exam as documented.  Counseling done include healthy lifestyle involving committing to 150 minutes of exercise per week, heart healthy diet, and attaining healthy weight. The importance of adequate sleep also discussed.  Changes in health habits are decided on by patient with goals and time frames set for achieving them. Immunization and cancer screening  needs are specifically addressed at this visit 

## 2021-06-14 NOTE — Patient Instructions (Signed)
Please get your labs done today.  You have been referred to cardiology for your chest pain and SOB.  It is important that you exercise regularly at least 30 minutes 5 times a week.  Think about what you will eat, plan ahead. Choose " clean, green, fresh or frozen" over canned, processed or packaged foods which are more sugary, salty and fatty. 70 to 75% of food eaten should be vegetables and fruit. Three meals at set times with snacks allowed between meals, but they must be fruit or vegetables. Aim to eat over a 12 hour period , example 7 am to 7 pm, and STOP after  your last meal of the day. Drink water,generally about 64 ounces per day, no other drink is as healthy. Fruit juice is best enjoyed in a healthy way, by EATING the fruit.  Thanks for choosing [facility], we consider it a privelige to serve you.
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
## 2021-06-14 NOTE — Assessment & Plan Note (Addendum)
DASH diet and commitment to daily physical activity for a minimum of 30 minutes discussed and encouraged, as a part of hypertension management. The importance of attaining a healthy weight is also discussed.  BP/Weight 06/14/2021 03/30/2021 01/30/2021 11/29/2020 10/28/2020 10/11/2020 10/03/2020  Systolic BP 144 124 130 142 140 145 151  Diastolic BP 87 70 78 80 86 84 80  Wt. (Lbs) 242 238 243 249 250 246 244  BMI 31.07 30.56 31.2 31.97 32.1 31.58 31.33  he has not taken his losartan  today.

## 2021-06-15 ENCOUNTER — Telehealth: Payer: Self-pay | Admitting: *Deleted

## 2021-06-15 ENCOUNTER — Other Ambulatory Visit: Payer: Self-pay | Admitting: Nurse Practitioner

## 2021-06-15 DIAGNOSIS — E559 Vitamin D deficiency, unspecified: Secondary | ICD-10-CM

## 2021-06-15 DIAGNOSIS — E781 Pure hyperglyceridemia: Secondary | ICD-10-CM

## 2021-06-15 LAB — LIPID PANEL
Chol/HDL Ratio: 6.6 ratio — ABNORMAL HIGH (ref 0.0–5.0)
Cholesterol, Total: 263 mg/dL — ABNORMAL HIGH (ref 100–199)
HDL: 40 mg/dL (ref >39)
LDL Chol Calc (NIH): 112 mg/dL — ABNORMAL HIGH (ref 0–99)
Triglycerides: 634 mg/dL (ref 0–149)
VLDL Cholesterol Cal: 111 mg/dL — ABNORMAL HIGH (ref 5–40)

## 2021-06-15 LAB — CMP14+EGFR
ALT: 23 IU/L (ref 0–44)
AST: 21 IU/L (ref 0–40)
Albumin/Globulin Ratio: 2.1 (ref 1.2–2.2)
Albumin: 4.6 g/dL (ref 3.8–4.9)
Alkaline Phosphatase: 101 IU/L (ref 44–121)
BUN/Creatinine Ratio: 17 (ref 9–20)
BUN: 17 mg/dL (ref 6–24)
Bilirubin Total: 0.5 mg/dL (ref 0.0–1.2)
CO2: 22 mmol/L (ref 20–29)
Calcium: 9.3 mg/dL (ref 8.7–10.2)
Chloride: 104 mmol/L (ref 96–106)
Creatinine, Ser: 0.98 mg/dL (ref 0.76–1.27)
Globulin, Total: 2.2 g/dL (ref 1.5–4.5)
Glucose: 102 mg/dL — ABNORMAL HIGH (ref 70–99)
Potassium: 4.5 mmol/L (ref 3.5–5.2)
Sodium: 139 mmol/L (ref 134–144)
Total Protein: 6.8 g/dL (ref 6.0–8.5)
eGFR: 92 mL/min/{1.73_m2} (ref >59)

## 2021-06-15 LAB — CBC
Hematocrit: 46.5 % (ref 37.5–51.0)
Hemoglobin: 15.9 g/dL (ref 13.0–17.7)
MCH: 30.2 pg (ref 26.6–33.0)
MCHC: 34.2 g/dL (ref 31.5–35.7)
MCV: 88 fL (ref 79–97)
Platelets: 204 10*3/uL (ref 150–450)
RBC: 5.27 x10E6/uL (ref 4.14–5.80)
RDW: 13.7 % (ref 11.6–15.4)
WBC: 6.2 10*3/uL (ref 3.4–10.8)

## 2021-06-15 LAB — HEMOGLOBIN A1C
Est. average glucose Bld gHb Est-mCnc: 117 mg/dL
Hgb A1c MFr Bld: 5.7 % — ABNORMAL HIGH (ref 4.8–5.6)

## 2021-06-15 LAB — TSH: TSH: 1.35 u[IU]/mL (ref 0.450–4.500)

## 2021-06-15 LAB — VITAMIN D 25 HYDROXY (VIT D DEFICIENCY, FRACTURES): Vit D, 25-Hydroxy: 22.7 ng/mL — ABNORMAL LOW (ref 30.0–100.0)

## 2021-06-15 LAB — HIV ANTIBODY (ROUTINE TESTING W REFLEX): HIV Screen 4th Generation wRfx: NONREACTIVE

## 2021-06-15 MED ORDER — VITAMIN D3 25 MCG (1000 UT) PO CAPS: 1000.0000 [IU] | ORAL_CAPSULE | Freq: Every day | ORAL | 3 refills | Status: AC

## 2021-06-15 NOTE — Chronic Care Management (AMB) (Signed)
°  Care Management   Note  06/15/2021 Name: Richard Wagner MRN: [id number] DOB: [birth year]  Richard Wagner is a 55 y.o. year old male who is a primary care patient of Heather Roberts, NP. I reached out to [employer] by phone today in response to a referral sent by Richard Wagner primary care provider.   [name] was given information about care management services today including:  Care management services include personalized support from designated clinical staff supervised by his physician, including individualized plan of care and coordination with other care providers 24/7 contact phone numbers for assistance for urgent and routine care needs. The patient may stop care management services at any time by phone call to the office staff.  Patient agreed to services and verbal consent obtained.   Follow up plan: Telephone appointment with care management team member scheduled for: 06/27/2021  Burman Nieves, CCMA Care Guide, Embedded Care Coordination [facility]   Care Management  Direct Dial: [phone]

## 2021-06-16 ENCOUNTER — Other Ambulatory Visit: Payer: Self-pay | Admitting: Nurse Practitioner

## 2021-06-16 DIAGNOSIS — E781 Pure hyperglyceridemia: Secondary | ICD-10-CM

## 2021-06-16 MED ORDER — OMEGA-3-ACID ETHYL ESTERS 1 G PO CAPS: 2.0000 g | ORAL_CAPSULE | Freq: Two times a day (BID) | ORAL | 1 refills | Status: DC

## 2021-06-27 ENCOUNTER — Ambulatory Visit: Payer: 59 | Admitting: Pharmacist

## 2021-06-27 ENCOUNTER — Telehealth: Payer: Self-pay | Admitting: Pulmonary Disease

## 2021-06-27 DIAGNOSIS — E781 Pure hyperglyceridemia: Secondary | ICD-10-CM

## 2021-06-27 DIAGNOSIS — G4733 Obstructive sleep apnea (adult) (pediatric): Secondary | ICD-10-CM | POA: Diagnosis not present

## 2021-06-27 DIAGNOSIS — Z8673 Personal history of transient ischemic attack (TIA), and cerebral infarction without residual deficits: Secondary | ICD-10-CM

## 2021-06-27 MED ORDER — ROSUVASTATIN CALCIUM 20 MG PO TABS: 20.0000 mg | ORAL_TABLET | Freq: Every day | ORAL | 3 refills | Status: AC
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
## 2021-06-27 NOTE — Telephone Encounter (Signed)
HST 06/09/21 >> AHI 31.9, SpO2 low 80%   Please inform him that his sleep study shows severe obstructive sleep apnea.  Please arrange for ROV with me or NP to discuss treatment options.

## 2021-06-27 NOTE — Chronic Care Management (AMB) (Signed)
Care Management   Pharmacy Note  06/27/2021 Name: Richard Wagner MRN: [id number] DOB: [birth year]  Summary: General: Care coordination team is signing off at this time. The below plan has been discussed with the PCP. If further needs are required, please submit a new care coordination referral.  Hyperlipidemia/History of stroke: Patient reports he had stroke while in prison ~2019-2020. He thinks he had another stroke on 06/25/20. He went to [facility] and CT was negative but he left against medical advice before workup was completed.  Uncontrolled. LDL above goal of <70 due to very high risk given established clinical ASCVD per 2020 AACE/ACE guidelines. Triglycerides above goal of <150 per 2020 AACE/ACE guidelines. Current medications: pravastatin 40 mg by mouth once daily, fenofibrate 145 mg by mouth once daily, and Lovaza 2g by mouth twice daily Antiplatelet: aspirin 81 mg by mouth daily Patient reports he stopped smoking in 2009 and does not drink alcohol; however, patient reports diet high in sweets Patient was unable to afford Lovaza; has started taking over the counter fish oil 1 capsule by mouth daily Due to history of stroke, would recommend increasing to high intensity statin for secondary prevention with an LDL goal <70. After discussion with PCP, will discontinue pravastatin and initiate rosuvastatin 20 mg by mouth daily. Would not recommend rosuvastatin 40 mg at this time due to concomitant use of fenofibrate which may increase the risk of muscle toxicity (including rhabdomyolysis). Discontinue Lovaza due to high cost. Instructed patient to increase intake of over the counter fish oil to 2 capsules by mouth twice daily. This will not be equivalent to Lovaza so there is room to increase if needed.  Continue fenofibrate 145 mg by mouth daily Reduce/stop eating foods high in sugar which will help lower triglycerides Re-check lipid panel in 4-12 weeks  Subjective: Richard Wagner is a 56  y.o. year old male who is a primary care patient of Richard Roberts, NP. The Care Management team was consulted for assistance with care management and care coordination needs.    Engaged with patient by telephone for initial visit in response to provider referral for pharmacy case management and/or care coordination services.   The patient was given information about Care Management services today including:  Care Management services includes personalized support from designated clinical staff supervised by the patient's primary care provider, including individualized plan of care and coordination with other care providers. 24/7 contact phone numbers for assistance for urgent and routine care needs. The patient may stop case management services at any time by phone call to the office staff.  Patient agreed to services and consent obtained.  Assessment:  Review of patient status, including review of consultants reports, laboratory and other test data, was performed as part of comprehensive evaluation and provision of chronic care management services.   SDOH (Social Determinants of Health) assessments and interventions performed:    Objective:  Lab Results  Component Value Date   CREATININE 0.98 06/14/2021   CREATININE 1.07 10/05/2020   CREATININE 1.11 06/25/2020    Lab Results  Component Value Date   HGBA1C 5.7 (H) 06/14/2021       Component Value Date/Time   CHOL 263 (H) 06/14/2021 1205   TRIG 634 (HH) 06/14/2021 1205   HDL 40 06/14/2021 1205   CHOLHDL 6.6 (H) 06/14/2021 1205   LDLCALC 112 (H) 06/14/2021 1205    Other: (TSH, CBC, Vit D, etc.)  Clinical ASCVD: Yes  The 10-year ASCVD risk score (Arnett DK, et al., 2019)  is: 11.4%   Values used to calculate the score:     Age: 57 years     Sex: Male     Is Non-Hispanic African American: No     Diabetic: No     Tobacco smoker: No     Systolic Blood Pressure: 144 mmHg     Is BP treated: Yes     HDL Cholesterol: 40 mg/dL      Total Cholesterol: 263 mg/dL    BP Readings from Last 3 Encounters:  06/14/21 (!) 144/87  03/30/21 124/70  01/30/21 130/78    Care Plan  No Known Allergies  Medications Reviewed Today     Reviewed by Gavin Pound, RPH (Pharmacist) on 06/27/21 at 1345  Med List Status: <None>   Medication Order Taking? Sig Documenting Provider Last Dose Status Informant  aspirin 81 MG EC tablet [id number] Yes Take 1 tablet (81 mg total) by mouth daily. Swallow whole. Donell Beers, FNP Taking Active   Cholecalciferol (VITAMIN D3) 25 MCG (1000 UT) CAPS [id number] No Take 1 capsule (1,000 Units total) by mouth daily. Donell Beers, FNP Unknown Active   cyclobenzaprine (FLEXERIL) 10 MG tablet [id number] Yes Take 1 tablet (10 mg total) by mouth at bedtime. One tablet every night at bedtime as needed for spasm. Richard Roberts, NP Taking Active   diclofenac Sodium (VOLTAREN) 1 % GEL [id number] Yes SMARTSIG:Gram(s) Topical 3 Times Daily PRN [provider] Taking Active   fenofibrate (TRICOR) 145 MG tablet [id number] Yes Take 1 tablet (145 mg total) by mouth daily. Richard Roberts, NP Taking Active   gabapentin (NEURONTIN) 100 MG capsule [id number] Yes Take 100 mg by mouth 3 (three) times daily. [provider] Taking Active   gabapentin (NEURONTIN) 300 MG capsule [id number] Yes Take 300 mg by mouth 3 (three) times daily. [provider] Taking Active   ibuprofen (ADVIL) 800 MG tablet [id number] Yes Take 1 tablet (800 mg total) by mouth every 8 (eight) hours as needed. Richard Roberts, NP Taking Active   losartan (COZAAR) 50 MG tablet [id number] Yes Take 1 tablet (50 mg total) by mouth daily. Richard Roberts, NP Taking Active   meclizine (ANTIVERT) 25 MG tablet [id number] Yes Take 1 tablet (25 mg total) by mouth 3 (three) times daily as needed for dizziness. Richard Roberts, NP Taking Active   omega-3 acid ethyl esters (LOVAZA) 1 g capsule [id number] No Take 2 capsules (2 g  total) by mouth 2 (two) times daily.  Patient not taking: Reported on 06/27/2021   Donell Beers, FNP Not Taking Active   Omega-3 Fatty Acids (FISH OIL) 1200 MG CAPS [id number] Yes Take 1 capsule by mouth daily. [provider] Taking Active Self  Oxycodone HCl 10 MG TABS [id number] Yes Take 10 mg by mouth 3 (three) times daily. [provider] Taking Active   pravastatin (PRAVACHOL) 40 MG tablet [id number] Yes Take 1 tablet (40 mg total) by mouth daily. Richard Roberts, NP Taking Active   tiZANidine (ZANAFLEX) 4 MG tablet [id number] No Take 4 mg by mouth 3 (three) times daily as needed.  Patient not taking: Reported on 06/27/2021   [provider] Not Taking Active             Patient Active Problem List   Diagnosis Date Noted   Annual physical exam 06/14/2021   Chest pain 06/14/2021   Rash 10/28/2020   Laceration of right thumb 10/28/2020   Snoring 10/03/2020  Chronic back pain 07/21/2020   Hyperlipidemia 07/05/2020   Poverty 07/05/2020   Encounter to establish care 06/28/2020   Bilateral foot pain 06/28/2020   Essential hypertension 06/28/2020   Vertigo 06/28/2020   Immunization due 06/28/2020    Conditions to be addressed/monitored: HLD  Care Plan : Medication Management  Updates made by Gavin PoundWalston, Mireya Meditz J, RPH since 06/27/2021 12:00 AM     Problem: HLD   Priority: High  Onset Date: 06/27/2021     Goal: Disease Progression Prevention   Start Date: 06/27/2021  Expected End Date: 07/27/2021  This Visit's Progress: On track  Priority: High  Note:   Current Barriers:  Unable to independently afford treatment regimen Unable to achieve control of hyperlipidemia  Pharmacist Clinical Goal(s):  Through collaboration with PharmD and provider, patient will  Verbalize ability to afford treatment regimen Achieve control of hyperlipidemia as evidenced by improved LDL and improved triglycerides   Interventions: 1:1 collaboration with Richard RobertsGray, Joseph  M, NP regarding development and update of comprehensive plan of care as evidenced by provider attestation and co-signature Inter-disciplinary care team collaboration (see longitudinal plan of care) Comprehensive medication review performed; medication list updated in electronic medical record  Hyperlipidemia/History of stroke - New goal.: Patient reports he had stroke while in prison ~2019-2020. He thinks he had another stroke on 06/25/20. [facility] and [facility] was negative but he left against medical advice before workup was completed.  Uncontrolled. LDL above goal of <70 due to very high risk given established clinical ASCVD per 2020 AACE/ACE guidelines. Triglycerides above goal of <150 per 2020 AACE/ACE guidelines. Current medications: pravastatin 40 mg by mouth once daily, fenofibrate 145 mg by mouth once daily, and Lovaza 2g by mouth twice daily Antiplatelet: aspirin 81 mg by mouth daily Patient reports he stopped smoking in 2009 and does not drink alcohol; however, patient reports diet high in sweets Intolerances: none Taking medications as directed: no, patient was unable to afford Lovaza; has started taking over the counter fish oil 1 capsule by mouth daily Side effects thought to be attributed to current medication regimen: no Encourage dietary reduction of high fat containing foods such as butter, nuts, bacon, egg yolks, etc. Reviewed risks of hyperlipidemia, principles of treatment and consequences of untreated hyperlipidemia Discussed need for medication compliance Due to history of stroke, would recommend increasing to high intensity statin for secondary prevention with an LDL goal <70. After discussion with PCP, will discontinue pravastatin and initiate rosuvastatin 20 mg by mouth daily. Would not recommend rosuvastatin 40 mg at this time due to concomitant use of fenofibrate which may increase the risk of muscle toxicity (including rhabdomyolysis). Discontinue Lovaza due to high  cost. Instructed patient to increase intake of over the counter fish oil to 2 capsules by mouth twice daily. This will not be equivalent to Lovaza so there is room to increase if needed.  Continue fenofibrate 145 mg by mouth daily Reduce/stop eating foods high in sugar which will help lower triglycerides Re-check lipid panel in 4-12 weeks  Patient Goals/Self-Care Activities Patient will:  Focus on medication adherence by keeping up with prescription refills and either using a pill box or reminders to take your medications at the prescribed times Engage in dietary modifications by decreased fat intake and fewer sweetened foods & beverages  Follow Up Plan: The patient has been provided with contact information for the care management team and has been advised to call with any health related questions or concerns.  Medication Assistance:  None required.  Patient affirms current coverage meets needs.  Follow Up:  Patient agrees to Care Plan and Follow-up.  Plan: The patient has been provided with contact information for the care management team and has been advised to call with any health related questions or concerns.   Domenic Morashristopher Khambrel Amsden, PharmD, Patsy BaltimoreBCACP, CPP Clinical Pharmacist Practitioner [facility] [phone]

## 2021-06-29 NOTE — Telephone Encounter (Signed)
Called and spoke to patient. Gave him HST results and set him up with an appt for Monday 07-03-21 with Derl Barrow NP to discuss options.

## 2021-07-01 LAB — COLOGUARD: COLOGUARD: NEGATIVE

## 2021-07-03 ENCOUNTER — Ambulatory Visit (INDEPENDENT_AMBULATORY_CARE_PROVIDER_SITE_OTHER): Payer: 59 | Admitting: Primary Care

## 2021-07-03 ENCOUNTER — Encounter: Payer: Self-pay | Admitting: Primary Care

## 2021-07-03 ENCOUNTER — Telehealth: Payer: Self-pay | Admitting: Primary Care

## 2021-07-03 ENCOUNTER — Other Ambulatory Visit: Payer: Self-pay

## 2021-07-03 VITALS — BP 122/80 | HR 74 | Temp 98.0°F | Ht 74.0 in | Wt 242.2 lb

## 2021-07-03 DIAGNOSIS — G4733 Obstructive sleep apnea (adult) (pediatric): Secondary | ICD-10-CM
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
## 2021-07-03 NOTE — Assessment & Plan Note (Addendum)
-   HST 06/09/21 showed severe OSA, AHI 31.9/hr - Reviewed risks of untreated sleep apnea and treatment options. Due to severity of sleep apnea recommend patient be started on CPAP therapy  - Sending in DME order for new CPAP start auto titrate 5-20cm with mask of choice - Advised patient to aim to wear CPAP every night for 4-6 hours of longer. Also advised against driving if experiencing excessive daytime sleepiness and encourage weight loss efforts.  - FU in 31-90 days after starting CPAP for compliance check

## 2021-07-03 NOTE — Progress Notes (Signed)
Reviewed and agree with assessment/plan. ° ° °Mayli Covington, MD °[facility] °07/03/2021, 5:13 PM °Pager:  [phone] ° °

## 2021-07-03 NOTE — Patient Instructions (Addendum)
Sleep study on 06/09/21 showed that you have severe obstructive sleep apnea  Treatment options include weight loss, oral appliance, CPAP therapy or referral to ENT for surgical options  Orders: New CPAP start auto titrate 5-20cm h20 re: severe OSA/snoring (high priority, may need patient assistance)  Follow-up: 3-4 months with Dr Craige Cotta or APP    Sleep Apnea Sleep apnea is a condition in which breathing pauses or becomes shallow during sleep. People with sleep apnea usually snore loudly. They may have times when they gasp and stop breathing for 10 seconds or more during sleep. This may happen many times during the night. Sleep apnea disrupts your sleep and keeps your body from getting the rest that it needs. This condition can increase your risk of certain health problems, including: Heart attack. Stroke. Obesity. Type 2 diabetes. Heart failure. Irregular heartbeat. High blood pressure. The goal of treatment is to help you breathe normally again. What are the causes? The most common cause of sleep apnea is a collapsed or blocked airway. There are three kinds of sleep apnea: Obstructive sleep apnea. This kind is caused by a blocked or collapsed airway. Central sleep apnea. This kind happens when the part of the brain that controls breathing does not send the correct signals to the muscles that control breathing. Mixed sleep apnea. This is a combination of obstructive and central sleep apnea. What increases the risk? You are more likely to develop this condition if you: Are overweight. Smoke. Have a smaller than normal airway. Are older. Are male. Drink alcohol. Take sedatives or tranquilizers. Have a family history of sleep apnea. Have a tongue or tonsils that are larger than normal. What are the signs or symptoms? Symptoms of this condition include: Trouble staying asleep. Loud snoring. Morning headaches. Waking up gasping. Dry mouth or sore throat in the morning. Daytime  sleepiness and tiredness. If you have daytime fatigue because of sleep apnea, you may be more likely to have: Trouble concentrating. Forgetfulness. Irritability or mood swings. Personality changes. Feelings of depression. Sexual dysfunction. This may include loss of interest if you are male, or erectile dysfunction if you are male. How is this diagnosed? This condition may be diagnosed with: A medical history. A physical exam. A series of tests that are done while you are sleeping (sleep study). These tests are usually done in a sleep lab, but they may also be done at home. How is this treated? Treatment for this condition aims to restore normal breathing and to ease symptoms during sleep. It may involve managing health issues that can affect breathing, such as high blood pressure or obesity. Treatment may include: Sleeping on your side. Using a decongestant if you have nasal congestion. Avoiding the use of depressants, including alcohol, sedatives, and narcotics. Losing weight if you are overweight. Making changes to your diet. Quitting smoking. Using a device to open your airway while you sleep, such as: An oral appliance. This is a custom-made mouthpiece that shifts your lower jaw forward. A continuous positive airway pressure (CPAP) device. This device blows air through a mask when you breathe out (exhale). A nasal expiratory positive airway pressure (EPAP) device. This device has valves that you put into each nostril. A bi-level positive airway pressure (BIPAP) device. This device blows air through a mask when you breathe in (inhale) and breathe out (exhale). Having surgery if other treatments do not work. During surgery, excess tissue is removed to create a wider airway. Follow these instructions at home: Lifestyle Make any  lifestyle changes that your health care provider recommends. Eat a healthy, well-balanced diet. Take steps to lose weight if you are overweight. Avoid  using depressants, including alcohol, sedatives, and narcotics. Do not use any products that contain nicotine or tobacco. These products include cigarettes, chewing tobacco, and vaping devices, such as e-cigarettes. If you need help quitting, ask your health care provider. General instructions Take over-the-counter and prescription medicines only as told by your health care provider. If you were given a device to open your airway while you sleep, use it only as told by your health care provider. If you are having surgery, make sure to tell your health care provider you have sleep apnea. You may need to bring your device with you. Keep all follow-up visits. This is important. Contact a health care provider if: The device that you received to open your airway during sleep is uncomfortable or does not seem to be working. Your symptoms do not improve. Your symptoms get worse. Get help right away if: You develop: Chest pain. Shortness of breath. Discomfort in your back, arms, or stomach. You have: Trouble speaking. Weakness on one side of your body. Drooping in your face. These symptoms may represent a serious problem that is an emergency. Do not wait to see if the symptoms will go away. Get medical help right away. Call your local emergency services (911 in the [place] Do not drive yourself to the hospital. Summary Sleep apnea is a condition in which breathing pauses or becomes shallow during sleep. The most common cause is a collapsed or blocked airway. The goal of treatment is to restore normal breathing and to ease symptoms during sleep. This information is not intended to replace advice given to you by your health care provider. Make sure you discuss any questions you have with your health care provider. Document Revised: 01/18/2021 Document Reviewed: 05/20/2020 Elsevier Patient Education  2022 [employer].

## 2021-07-04 ENCOUNTER — Telehealth: Payer: Self-pay | Admitting: Adult Health

## 2021-07-04 NOTE — Telephone Encounter (Signed)
Call from on-call service , patient says CPAP is not covered thru his insurance [facility] [place] [facility] will need to try alternative .   Called patient and left vm , no anwer  Can we look into why not covered

## 2021-07-04 NOTE — Telephone Encounter (Signed)
See above message, patient had requested [employer] but may need to use another DME

## 2021-07-04 NOTE — Telephone Encounter (Signed)
Called and spoke with patient.  Patient stated [facility] does not accept Friday health insurance.  Patient stated he needed a cpap bad and is wanting to know if another company can supply his cpap?  Message routed to [facility] to assist

## 2021-07-04 NOTE — Telephone Encounter (Signed)
Patient calling because [facility] does not take [employer] health insurance. What other company can he use to get CPAP machine?

## 2021-07-05 NOTE — Telephone Encounter (Signed)
Noted.   Will forward to [facility] team.

## 2021-07-05 NOTE — Telephone Encounter (Signed)
I have sent this order to [facility] 

## 2021-07-06 NOTE — Telephone Encounter (Signed)
Per Nida Boatman w/ [facility], they have received pt's order.

## 2021-07-06 NOTE — Telephone Encounter (Signed)
Richard Wagner has sent the order to [facility] on 07/05/21.

## 2021-07-17 NOTE — Telephone Encounter (Signed)
Order has been sent to [facility] I have left a voice mail for the patient

## 2021-07-17 NOTE — Telephone Encounter (Signed)
Hey, [name] - will you please send this order to the requested DME?
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
## 2021-07-24 ENCOUNTER — Ambulatory Visit: Payer: 59 | Admitting: Cardiovascular Disease

## 2021-07-24 DIAGNOSIS — R072 Precordial pain: Secondary | ICD-10-CM

## 2021-08-01 ENCOUNTER — Encounter: Payer: 59 | Admitting: Nurse Practitioner

## 2021-08-10 ENCOUNTER — Telehealth: Payer: Self-pay | Admitting: Primary Care

## 2021-08-10 NOTE — Telephone Encounter (Signed)
Pt received CPAP and is already scheduled for appropriate f/u Closing encounter

## 2021-09-08 ENCOUNTER — Ambulatory Visit (INDEPENDENT_AMBULATORY_CARE_PROVIDER_SITE_OTHER): Payer: 59 | Admitting: Nurse Practitioner

## 2021-09-08 ENCOUNTER — Encounter: Payer: Self-pay | Admitting: Nurse Practitioner

## 2021-09-08 ENCOUNTER — Other Ambulatory Visit: Payer: Self-pay

## 2021-09-08 ENCOUNTER — Ambulatory Visit (HOSPITAL_COMMUNITY)
Admission: RE | Admit: 2021-09-08 | Discharge: 2021-09-08 | Disposition: A | Discharge status: Home / Self Care | Payer: 59 | Source: Ambulatory Visit | Attending: Nurse Practitioner | Admitting: Nurse Practitioner

## 2021-09-08 VITALS — BP 124/85 | HR 71 | Ht 74.0 in | Wt 235.0 lb

## 2021-09-08 DIAGNOSIS — G8929 Other chronic pain: Secondary | ICD-10-CM | POA: Insufficient documentation

## 2021-09-08 DIAGNOSIS — M25561 Pain in right knee: Secondary | ICD-10-CM | POA: Diagnosis not present

## 2021-09-08 DIAGNOSIS — M25562 Pain in left knee: Secondary | ICD-10-CM | POA: Diagnosis not present

## 2021-09-08 DIAGNOSIS — I10 Essential (primary) hypertension: Secondary | ICD-10-CM | POA: Diagnosis not present

## 2021-09-08 IMAGING — DX DG KNEE COMPLETE 4+V*L*
4 series · 4 of 4 positions shown · non-contrast
Comparison: None.

CLINICAL DATA: Worsening knee pain

EXAM:
LEFT KNEE - COMPLETE 4+ VIEW

[knee ap]
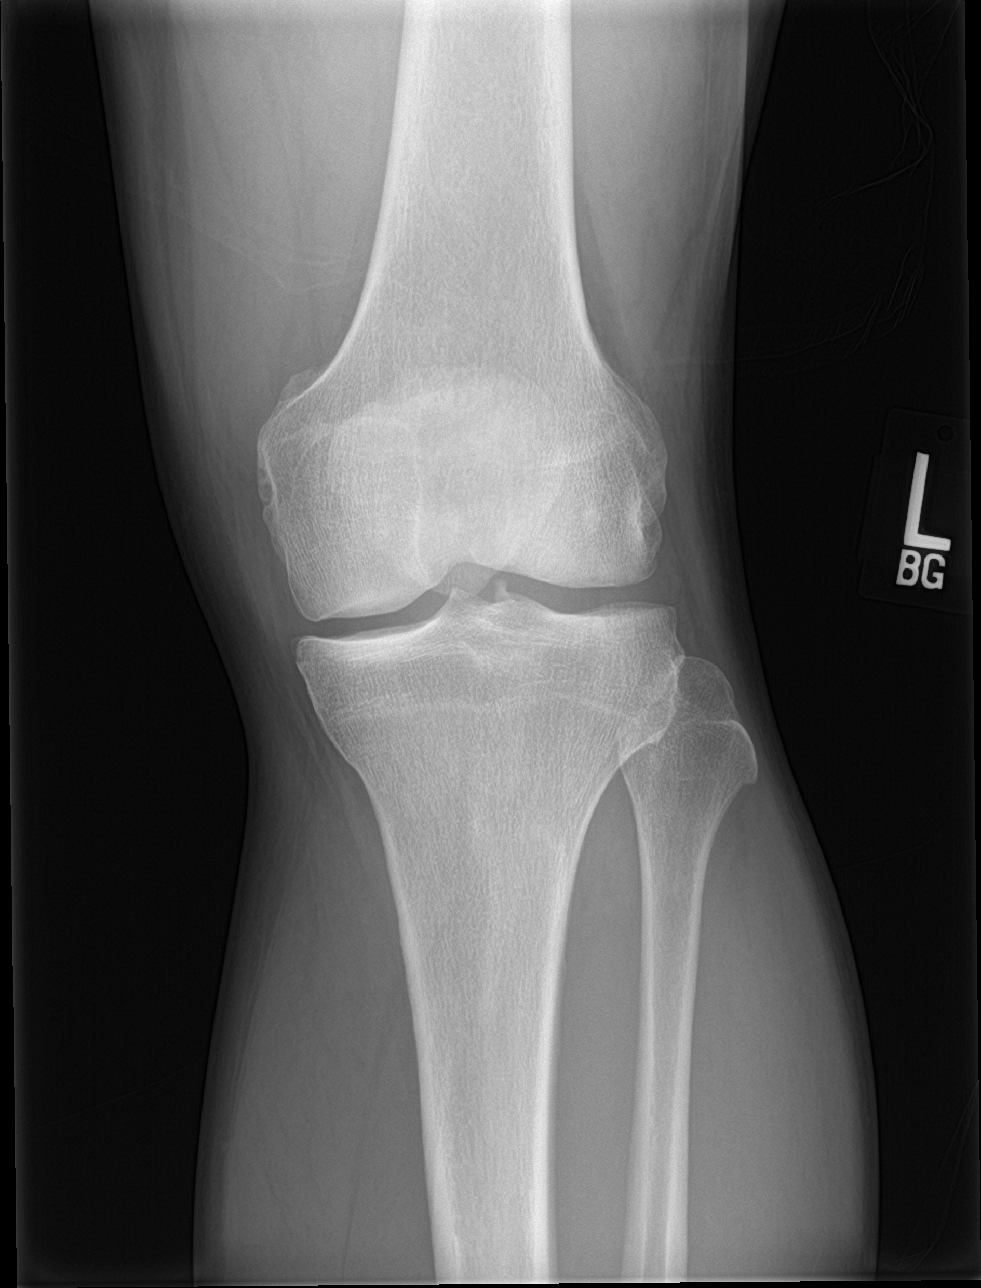

[knee obl (1 of 2)]
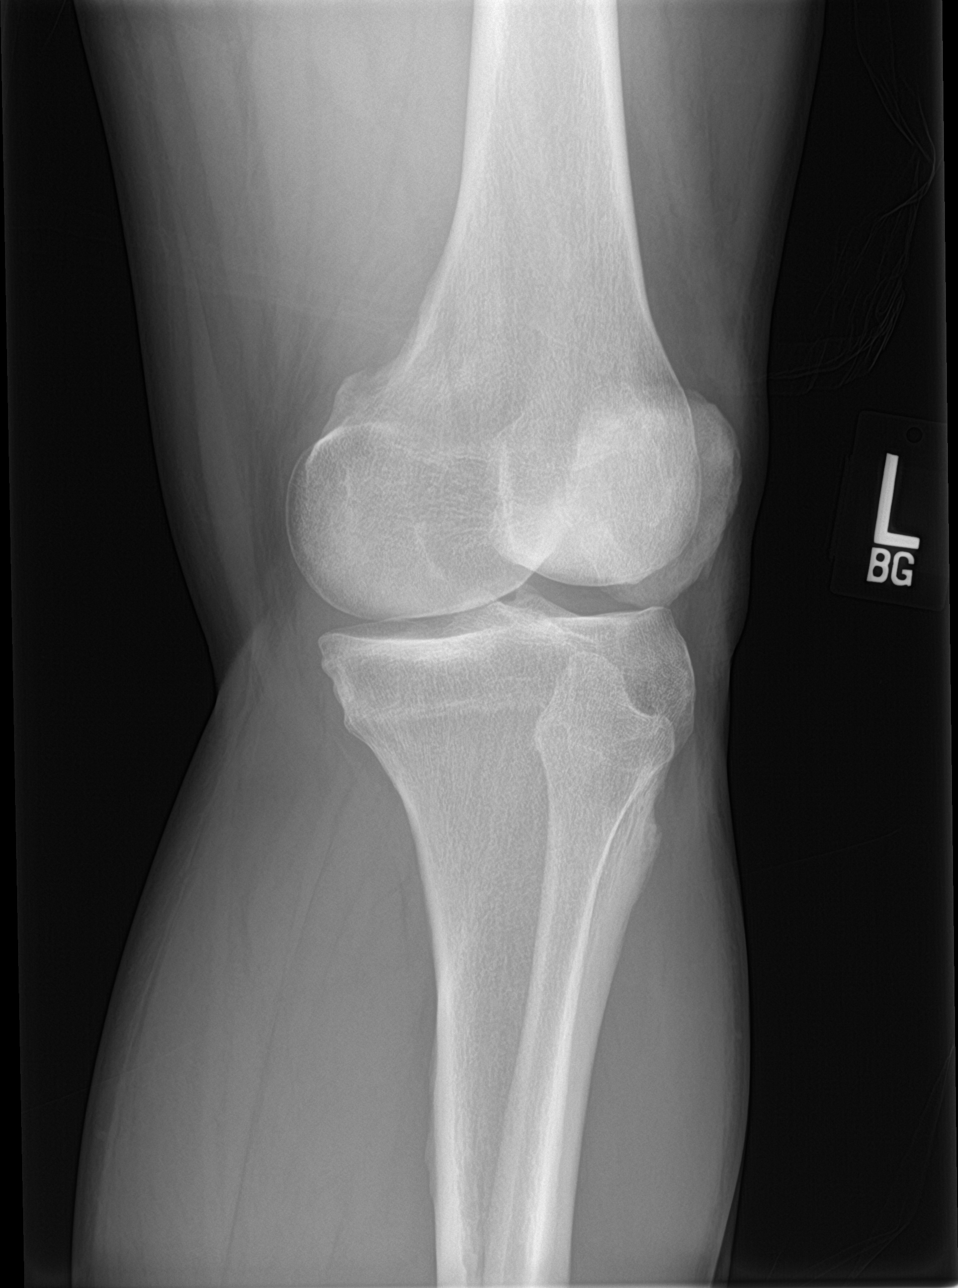

[knee obl (2 of 2)]
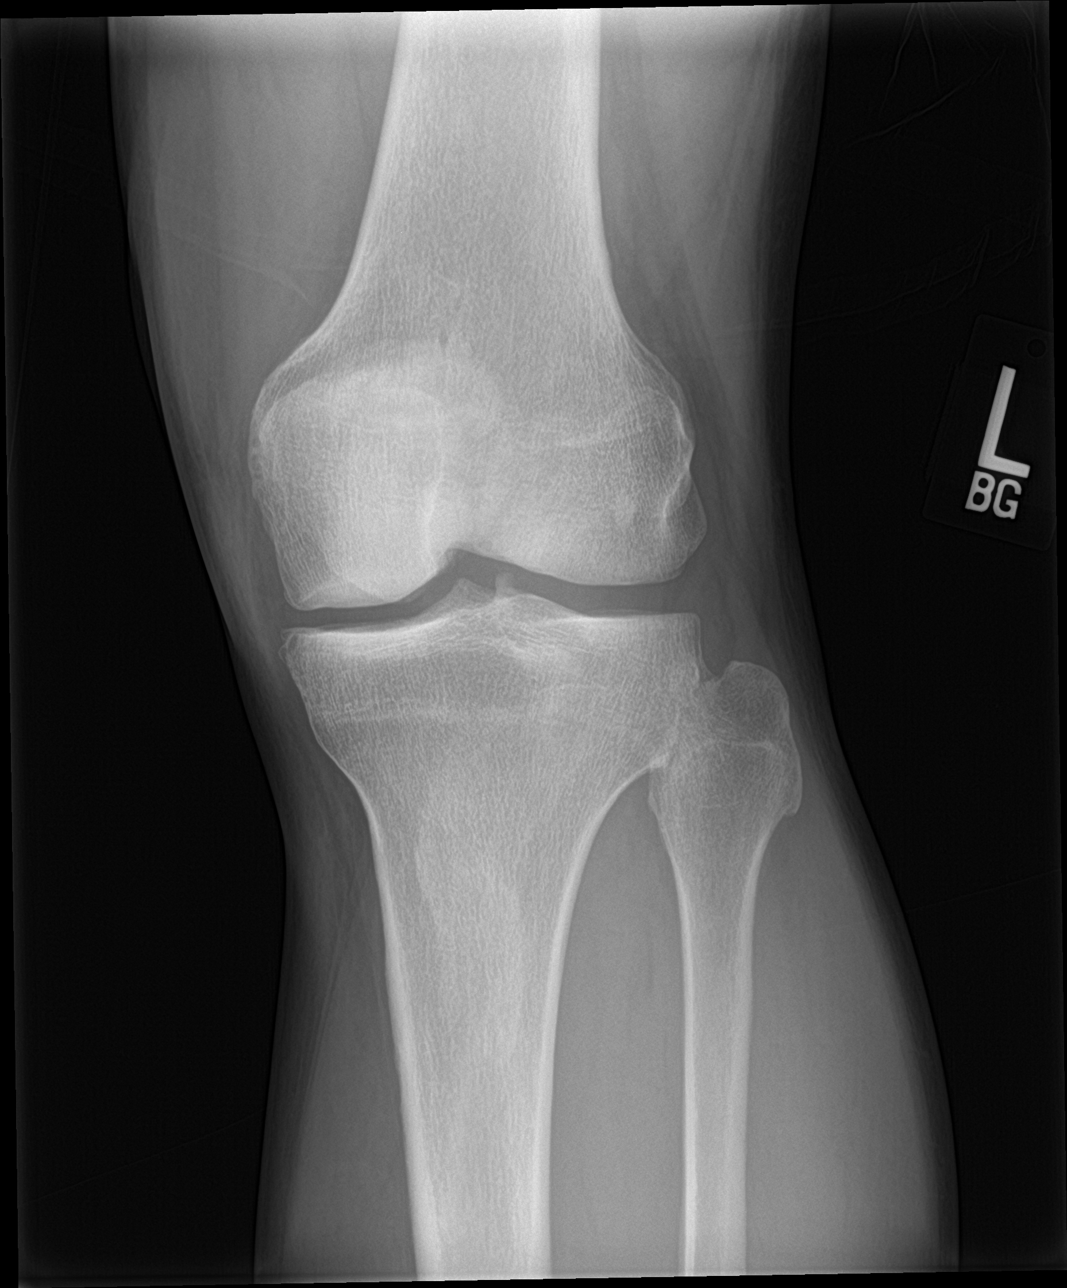

[knee lat]
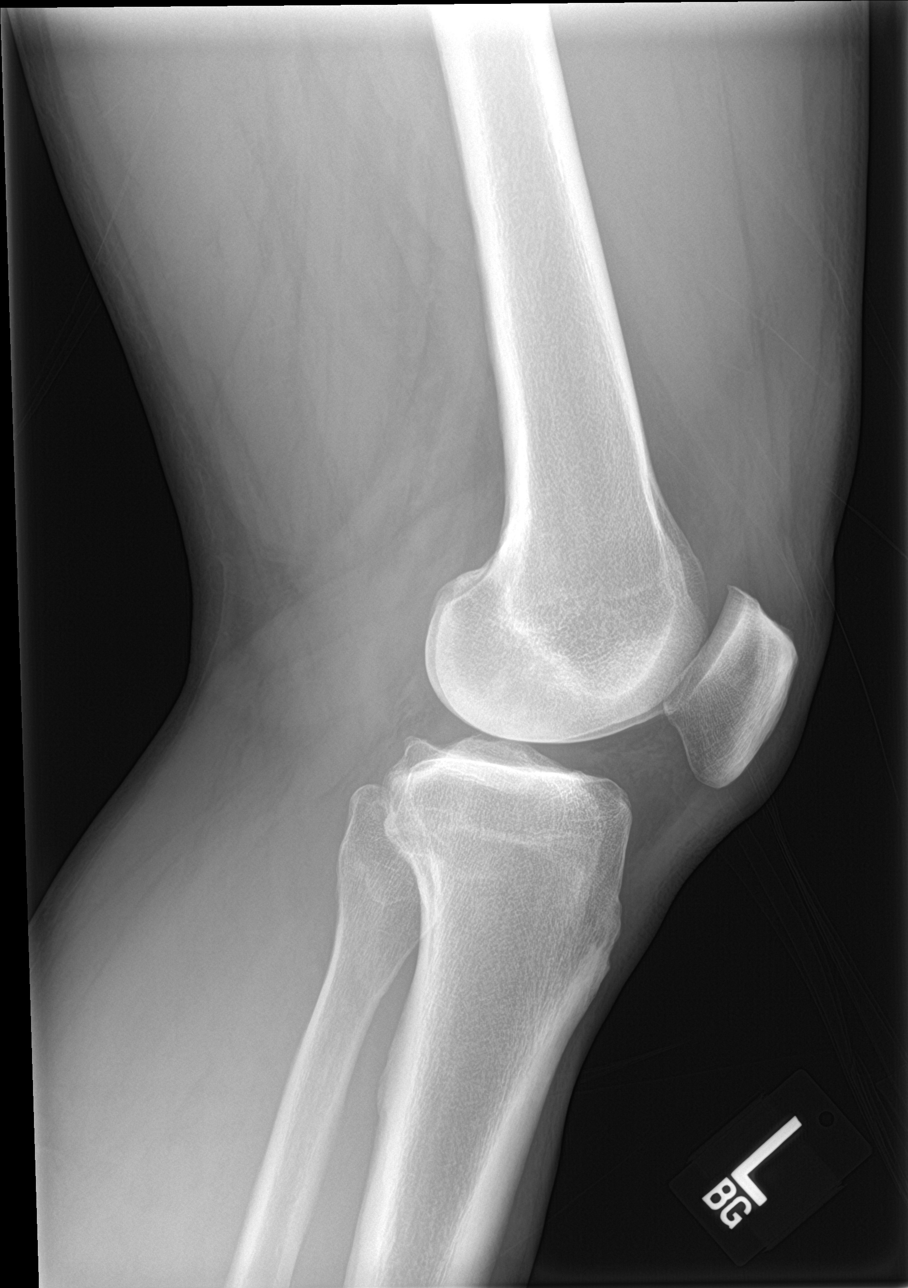

[4 of 4 positions shown; findings below may reference images not displayed]

FINDINGS: No evidence of fracture, dislocation, or joint effusion. No evidence
of arthropathy or other focal bone abnormality. Soft tissues are
unremarkable.
IMPRESSION: No acute osseous abnormality identified.

## 2021-09-08 IMAGING — DX DG KNEE COMPLETE 4+V*R*
4 series · 4 of 4 positions shown · non-contrast
Comparison: None.

CLINICAL DATA: Worsening right knee pain.

EXAM:
RIGHT KNEE - COMPLETE 4+ VIEW

[knee ap]
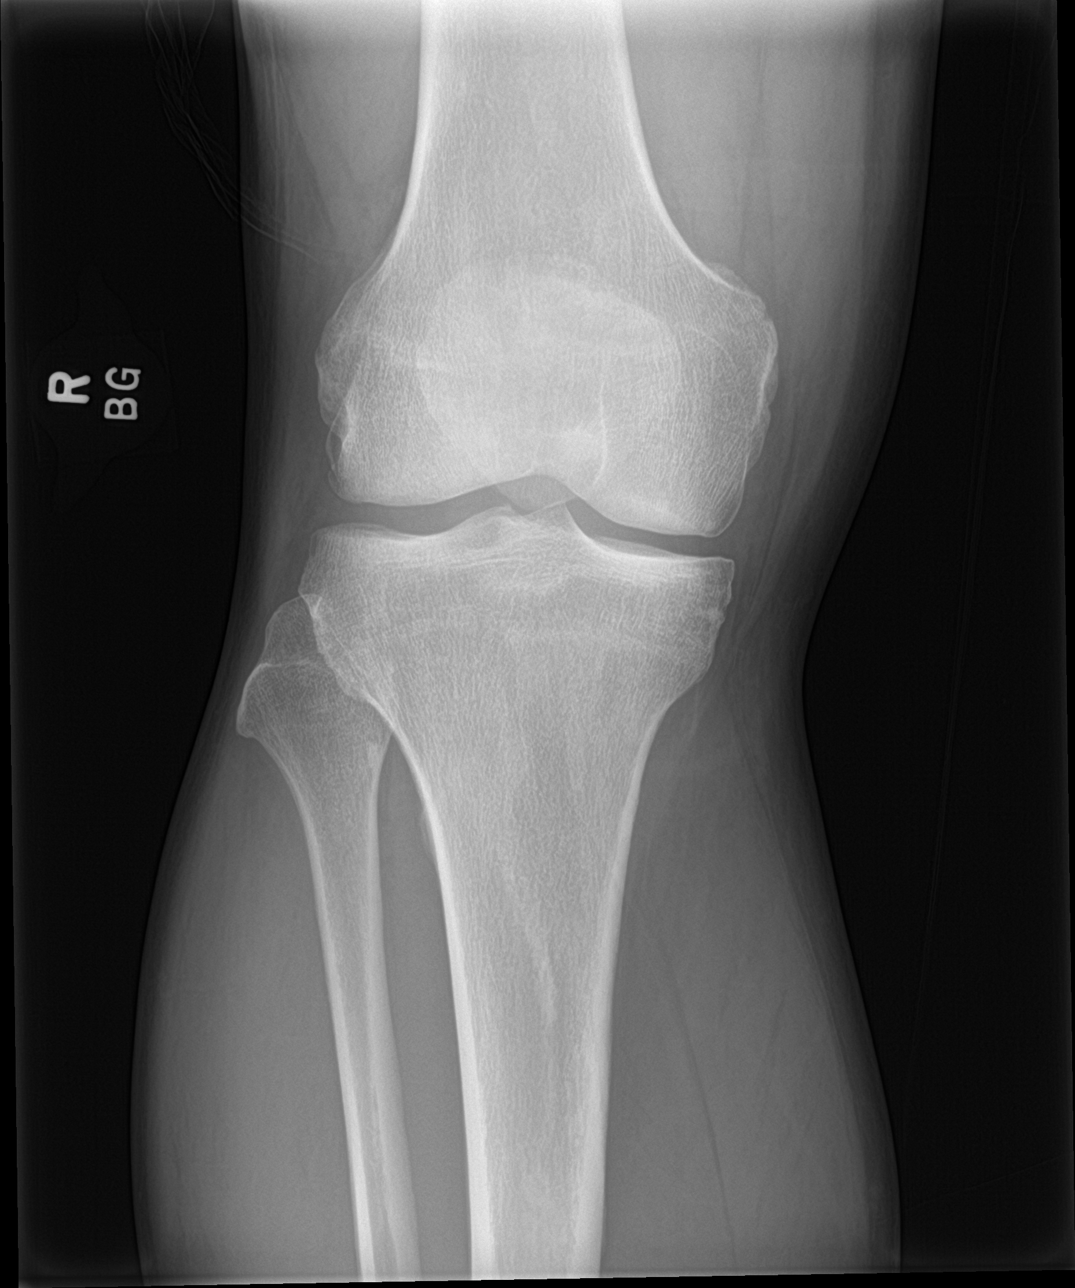

[knee obl (1 of 2)]
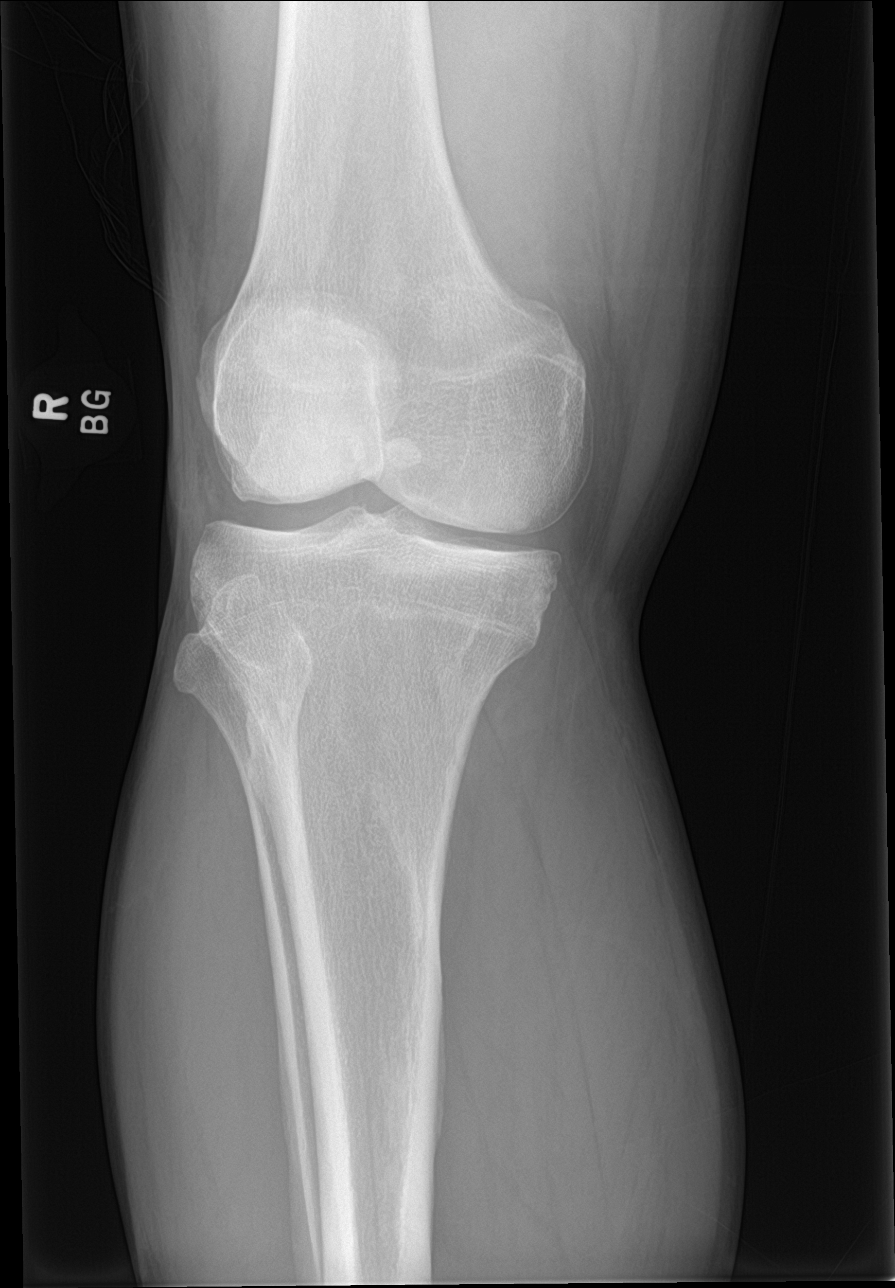

[knee obl (2 of 2)]
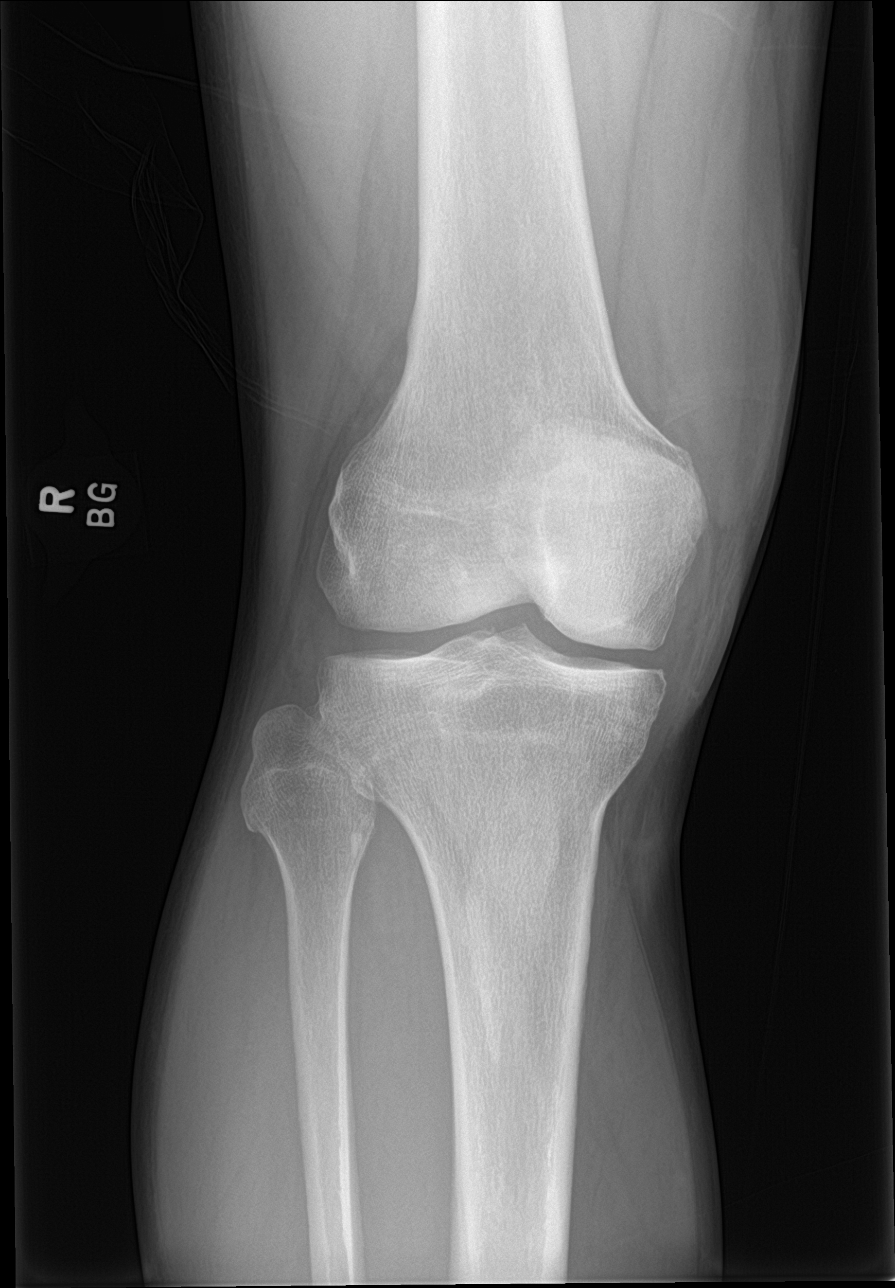

[knee lat]
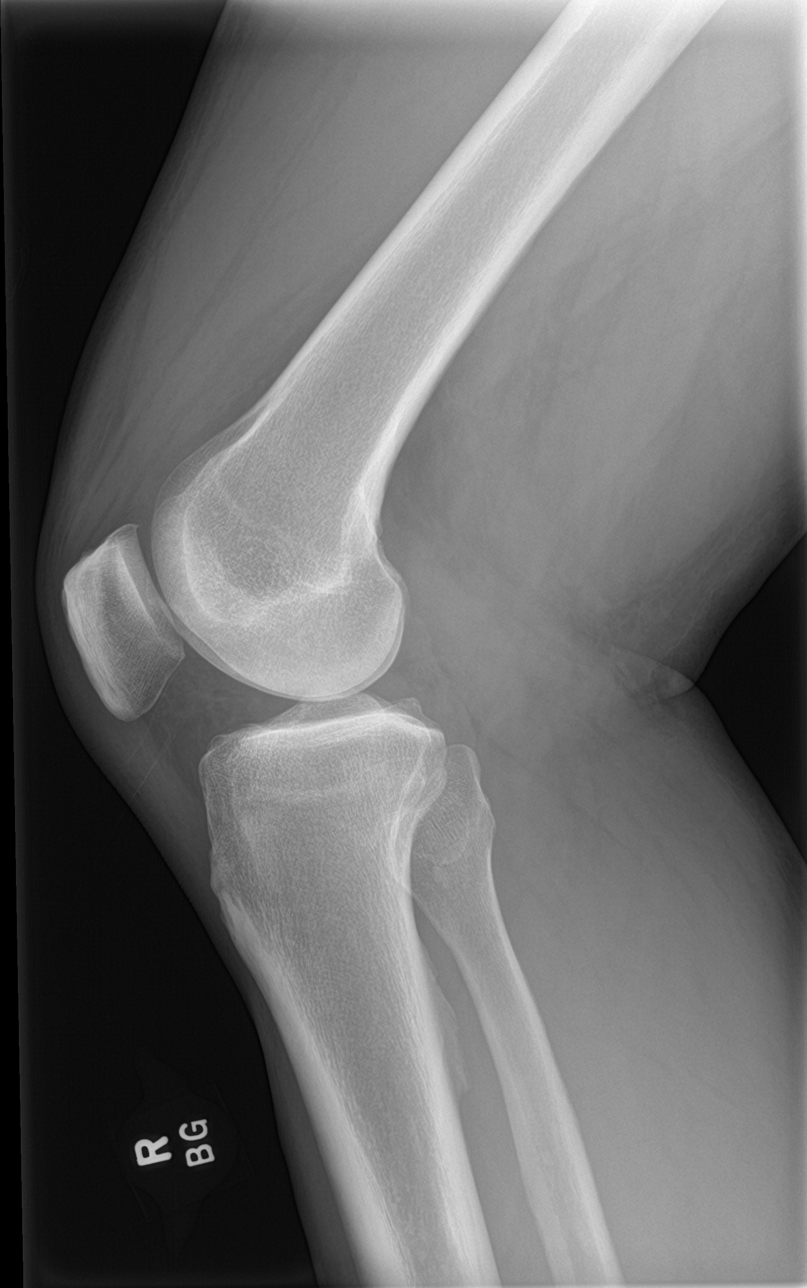

[4 of 4 positions shown; findings below may reference images not displayed]

FINDINGS: No evidence of fracture, dislocation, or joint effusion. No evidence
of arthropathy or other focal bone abnormality. Soft tissues are
unremarkable.
IMPRESSION: No acute osseous abnormality identified.

## 2021-09-08 MED ORDER — METHYLPREDNISOLONE ACETATE 80 MG/ML IJ SUSP
80.0000 mg | Freq: Once | INTRAMUSCULAR | Status: AC
  Administered 2021-09-08: 80 mg via INTRAMUSCULAR

## 2021-09-08 NOTE — Progress Notes (Signed)
Please review results with patient, bilateral knee x-rays shows no acute abnormality no fracture dislocation or joint effusion noted.  Patient should follow-up with orthopedics as discussed take pain medications as needed thank you.

## 2021-09-08 NOTE — Assessment & Plan Note (Signed)
BP Readings from Last 3 Encounters:  ?09/08/21 124/85  ?07/03/21 122/80  ?06/14/21 (!) 144/87  ?Chronic condition well-controlled on losartan 50 mg daily. ?Continue current medications.  ?

## 2021-09-08 NOTE — Progress Notes (Signed)
depo ? ?Richard Wagner     MRN: [id number]      DOB: January 22, 1966 ? ? ?HPI ?[name] is here for complains of worsening chronic  bilateral knee pain , patient states that both knee are popping, both knees feels so stiff , he can hardly walk on them . Symptoms started about two weeks ago, but that he has had bilateral knee pain for a few years now. Pt denies numbness, tingling, fever , chills.  Pt denies fall. He gets on his knees all the time while walking.  Patient states that he had nerve tests done last months where they put injections on both knees, and different areas on his legs, patient states that his pain management doctors sent him to do the nerve tests.  ?  ? ?ROS ?Denies recent fever or chills. ?Denies sinus pressure, nasal congestion, ear pain or sore throat. ?Denies chest congestion, productive cough or wheezing. ?Denies chest pains, palpitations and leg swelling ?Denies abdominal pain, nausea, vomiting,diarrhea or constipation.   ?Denies dysuria, frequency, hesitancy or incontinence. ?Denies headaches, seizures, numbness, or tingling. ?Denies depression, anxiety or insomnia. ?Denies skin break down or rash. ? ? ?PE ? ?BP 124/85 (BP Location: Left Arm, Patient Position: Sitting, Cuff Size: Large)   Pulse 71   Ht 6\' 2"  (1.88 m)   Wt 235 lb (106.6 kg)   SpO2 97%   BMI 30.17 kg/m?  ? ?Patient alert and oriented and in no cardiopulmonary distress. ? ?Chest: Clear to auscultation bilaterally. ? ?CVS: S1, S2 no murmurs, no S3.Regular rate ? ?MS: decreased ROM of bilateral knees, tenderness on palpation of bilateral knees, no fluid effusion noted, redness noted,  ? ?Skin: Intact, no ulcerations or rash noted. ? ?Psych: Good eye contact, normal affect. Memory intact not anxious or depressed appearing. ? ?CNS: CN 2-12 intact, power,  normal throughout.no focal deficits noted. ? ? ?Assessment & Plan ?Chronic pain of both knees ?Symptoms most consistent with osteo arthritis ?Chronic condition getting worse  since the past 2 weeks ?Stat x-ray of bilateral knees ordered ?Solu-Medrol 80 mg injection given today ?Patient told to schedule an appointment with orthopedics. ?Continue ice to both knees, exercise as tolerated  ?Taking oxycodone 10 mg 3 times daily and ibuprofen 800 mg every 8 hours as needed tizanidine 4 mg 3 times daily as needed for chronic low back pain  ? ?Essential hypertension ?BP Readings from Last 3 Encounters:  ?09/08/21 124/85  ?07/03/21 122/80  ?06/14/21 (!) 144/87  ?Chronic condition well-controlled on losartan 50 mg daily. ?Continue current medications.   ? ?

## 2021-09-08 NOTE — Assessment & Plan Note (Signed)
Symptoms most consistent with osteo arthritis ?Chronic condition getting worse since the past 2 weeks ?Stat x-ray of bilateral knees ordered ?Solu-Medrol 80 mg injection given today ?Patient told to schedule an appointment with orthopedics. ?Continue ice to both knees, exercise as tolerated  ?Taking oxycodone 10 mg 3 times daily and ibuprofen 800 mg every 8 hours as needed tizanidine 4 mg 3 times daily as needed for chronic low back pain  ?

## 2021-09-08 NOTE — Patient Instructions (Addendum)
Please get your knee xray done, continue to use your pain medication as needed. ?Please get DepoMedrol injection 80 mg today  ? ? ? ?Thanks for choosing [facility], we consider it a privelige to serve you.  ?

## 2021-09-11 ENCOUNTER — Ambulatory Visit: Payer: 59 | Admitting: Primary Care

## 2021-09-12 ENCOUNTER — Ambulatory Visit (INDEPENDENT_AMBULATORY_CARE_PROVIDER_SITE_OTHER): Payer: 59 | Admitting: Orthopaedic Surgery

## 2021-09-12 ENCOUNTER — Other Ambulatory Visit: Payer: Self-pay

## 2021-09-12 ENCOUNTER — Encounter: Payer: Self-pay | Admitting: Orthopaedic Surgery

## 2021-09-12 VITALS — BP 130/94 | HR 70 | Ht 74.0 in | Wt 238.2 lb

## 2021-09-12 DIAGNOSIS — M25562 Pain in left knee: Secondary | ICD-10-CM

## 2021-09-12 DIAGNOSIS — M25561 Pain in right knee: Secondary | ICD-10-CM

## 2021-09-12 DIAGNOSIS — G8929 Other chronic pain: Secondary | ICD-10-CM | POA: Diagnosis not present

## 2021-09-12 MED ORDER — NAPROXEN 500 MG PO TABS: 500.0000 mg | ORAL_TABLET | Freq: Two times a day (BID) | ORAL | 5 refills | Status: AC

## 2021-09-12 NOTE — Progress Notes (Signed)
My knees hurt ? ?He has had knee pain in both knees for about a month.  He went to [facility] on 09-08-21 for this.  He has tried ice, rest, heat, Tylenol with no relief.  He has no trauma.  He has some swelling, no giving way, no other joint pain problems.  I have reviewed the notes from 09-08-21.  He also had X-rays then. ? ?I have independently reviewed and interpreted x-rays of this patient done at another site by another physician or qualified health professional. ? ?He continues to hurt. ? ?Right knee has ROM of 0 to 110, crepitus, slight effusion, stable, no distal edema. ? ?Left knee has ROM of 0 to 105, crepitus, effusion, stable, no distal edema. ? ?NV intact to both knees.  No distal edema of either leg. ? ?Encounter Diagnosis  ?Name Primary?  ? Bilateral chronic knee pain Yes  ? ?I will give Naprosyn 500 po bid pc. ? ?PROCEDURE NOTE: ? ?The patient requests injections of the left knee , verbal consent was obtained. ? ?The left knee was prepped appropriately after time out was performed.  ? ?Sterile technique was observed and injection of 1 cc of DepoMedrol 40 mg with several cc's of plain xylocaine. Anesthesia was provided by ethyl chloride and a 20-gauge needle was used to inject the knee area. The injection was tolerated well.  A band aid dressing was applied. ? ?The patient was advised to apply ice later today and tomorrow to the injection sight as needed. ? ?PROCEDURE NOTE: ? ?The patient requests injections of the right knee , verbal consent was obtained. ? ?The right knee was prepped appropriately after time out was performed.  ? ?Sterile technique was observed and injection of 1 cc of DepoMedrol 40mg  with several cc's of plain xylocaine. Anesthesia was provided by ethyl chloride and a 20-gauge needle was used to inject the knee area. The injection was tolerated well.  A band aid dressing was applied. ? ?The patient was advised to apply ice later today and tomorrow to the injection sight  as needed. ? ?Return in two weeks. ? ?He may need MRI. ? ?Call if any problem. ? ?Precautions discussed. ? ?Electronically Signed ? , MD ?3/21/20239:06 AM ? ?

## 2021-09-18 ENCOUNTER — Ambulatory Visit (INDEPENDENT_AMBULATORY_CARE_PROVIDER_SITE_OTHER): Payer: 59 | Admitting: Primary Care

## 2021-09-18 ENCOUNTER — Encounter: Payer: Self-pay | Admitting: Primary Care

## 2021-09-18 ENCOUNTER — Other Ambulatory Visit: Payer: Self-pay

## 2021-09-18 VITALS — BP 124/76 | HR 72 | Temp 98.6°F | Ht 74.0 in | Wt 233.2 lb

## 2021-09-18 DIAGNOSIS — G4733 Obstructive sleep apnea (adult) (pediatric): Secondary | ICD-10-CM
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
## 2021-09-18 NOTE — Patient Instructions (Signed)
Recommendation: ?Continue to wear CPAP every night  ?Do not drive if experiencing excessive daytime sleepiness ? ?Orders: ?Adjust CPAP pressure 5-15cm h20 ? ?Follow-up: ?1 year with Dr. Craige Cotta or Waynetta Sandy NP ? ? ?CPAP and BIPAP Information ?CPAP and BIPAP are methods that use air pressure to keep your airways open and to help you breathe well. CPAP and BIPAP use different amounts of pressure. Your health care provider will tell you whether CPAP or BIPAP would be more helpful for you. ?CPAP stands for "continuous positive airway pressure." With CPAP, the amount of pressure stays the same while you breathe in (inhale) and out (exhale). ?BIPAP stands for "bi-level positive airway pressure." With BIPAP, the amount of pressure will be higher when you inhale and lower when you exhale. This allows you to take larger breaths. ?CPAP or BIPAP may be used in the hospital, or your health care provider may want you to use it at home. You may need to have a sleep study before your health care provider can order a machine for you to use at home. ?What are the advantages? ?CPAP or BIPAP can be helpful if you have: ?Sleep apnea. ?Chronic obstructive pulmonary disease (COPD). ?Heart failure. ?Medical conditions that cause muscle weakness, including muscular dystrophy or amyotrophic lateral sclerosis (ALS). ?Other problems that cause breathing to be shallow, weak, abnormal, or difficult. ?CPAP and BIPAP are most commonly used for obstructive sleep apnea (OSA) to keep the airways from collapsing when the muscles relax during sleep. ?What are the risks? ?Generally, this is a safe treatment. However, problems may occur, including: ?Irritated skin or skin sores if the mask does not fit properly. ?Dry or stuffy nose or nosebleeds. ?Dry mouth. ?Feeling gassy or bloated. ?Sinus or lung infection if the equipment is not cleaned properly. ?When should CPAP or BIPAP be used? ?In most cases, the mask only needs to be worn during sleep. Generally, the  mask needs to be worn throughout the night and during any daytime naps. People with certain medical conditions may also need to wear the mask at other times, such as when they are awake. Follow instructions from your health care provider about when to use the machine. ?What happens during CPAP or BIPAP? ?Both CPAP and BIPAP are provided by a small machine with a flexible plastic tube that attaches to a plastic mask that you wear. Air is blown through the mask into your nose or mouth. The amount of pressure that is used to blow the air can be adjusted on the machine. Your health care provider will set the pressure setting and help you find the best mask for you. ?Tips for using the mask ?Because the mask needs to be snug, some people feel trapped or closed-in (claustrophobic) when first using the mask. If you feel this way, you may need to get used to the mask. One way to do this is to hold the mask loosely over your nose or mouth and then gradually apply the mask more snugly. You can also gradually increase the amount of time that you use the mask. ?Masks are available in various types and sizes. If your mask does not fit well, talk with your health care provider about getting a different one. Some common types of masks include: ?Full face masks, which fit over the mouth and nose. ?Nasal masks, which fit over the nose. ?Nasal pillow or prong masks, which fit into the nostrils. ?If you are using a mask that fits over your nose and you tend  to breathe through your mouth, a chin strap may be applied to help keep your mouth closed. ?Use a skin barrier to protect your skin as told by your health care provider. ?Some CPAP and BIPAP machines have alarms that may sound if the mask comes off or develops a leak. ?If you have trouble with the mask, it is very important that you talk with your health care provider about finding a way to make the mask easier to tolerate. Do not stop using the mask. There could be a negative  impact on your health if you stop using the mask. ?Tips for using the machine ?Place your CPAP or BIPAP machine on a secure table or stand near an electrical outlet. ?Know where the on/off switch is on the machine. ?Follow instructions from your health care provider about how to set the pressure on your machine and when you should use it. ?Do not eat or drink while the CPAP or BIPAP machine is on. Food or fluids could get pushed into your lungs by the pressure of the CPAP or BIPAP. ?For home use, CPAP and BIPAP machines can be rented or purchased through home health care companies. Many different brands of machines are available. Renting a machine before purchasing may help you find out which particular machine works well for you. Your health insurance company may also decide which machine you may get. ?Keep the CPAP or BIPAP machine and attachments clean. Ask your health care provider for specific instructions. ?Check the humidifier if you have a dry stuffy nose or nosebleeds. Make sure it is working correctly. ?Follow these instructions at home: ?Take over-the-counter and prescription medicines only as told by your health care provider. Ask if you can take sinus medicine if your sinuses are blocked. ?Do not use any products that contain nicotine or tobacco. These products include cigarettes, chewing tobacco, and vaping devices, such as e-cigarettes. If you need help quitting, ask your health care provider. ?Keep all follow-up visits. This is important. ?Contact a health care provider if: ?You have redness or pressure sores on your head, face, mouth, or nose from the mask or head gear. ?You have trouble using the CPAP or BIPAP machine. ?You cannot tolerate wearing the CPAP or BIPAP mask. ?Someone tells you that you snore even when wearing your CPAP or BIPAP. ?Get help right away if: ?You have trouble breathing. ?You feel confused. ?Summary ?CPAP and BIPAP are methods that use air pressure to keep your airways open  and to help you breathe well. ?If you have trouble with the mask, it is very important that you talk with your health care provider about finding a way to make the mask easier to tolerate. Do not stop using the mask. There could be a negative impact to your health if you stop using the mask. ?Follow instructions from your health care provider about when to use the machine. ?This information is not intended to replace advice given to you by your health care provider. Make sure you discuss any questions you have with your health care provider. ?Document Revised: 01/18/2021 Document Reviewed: 05/20/2020 ?Elsevier Patient Education ? 2022 Elsevier Inc. ? ?

## 2021-09-18 NOTE — Assessment & Plan Note (Addendum)
-   HST 06/09/21 showed severe OSA, AHI 31.9/hr. He is 83% compliant with CPAP > 4 hours over the last 30 days and reports benefit from [place]. He is sleeping better at night and is less tired. Current CPAP pressure 5-20cm h20 with residual AHI 2.5. He is having some airleaks. Using nasal mask. Lowering CPAP pressure 5-15cm h20. Continue to encourage patient wear CPAP every night. Work on weight loss efforts. FU in 1 year or sooner if needed.  ?

## 2021-09-18 NOTE — Progress Notes (Signed)
Reviewed and agree with assessment/plan. ? ? ?Coralyn Helling, MD ?[facility] ?09/18/2021, 5:14 PM ?Pager:  [phone] ? ?

## 2021-09-20 ENCOUNTER — Ambulatory Visit: Payer: Self-pay | Admitting: Cardiovascular Disease

## 2021-09-20 DIAGNOSIS — R072 Precordial pain: Secondary | ICD-10-CM

## 2021-09-25 ENCOUNTER — Telehealth: Payer: Self-pay | Admitting: Primary Care

## 2021-09-26 ENCOUNTER — Encounter: Payer: Self-pay | Admitting: Orthopaedic Surgery

## 2021-09-26 ENCOUNTER — Ambulatory Visit (INDEPENDENT_AMBULATORY_CARE_PROVIDER_SITE_OTHER): Payer: 59 | Admitting: Orthopaedic Surgery

## 2021-09-26 VITALS — BP 147/84 | HR 79 | Ht 74.0 in | Wt 230.0 lb

## 2021-09-26 DIAGNOSIS — M25561 Pain in right knee: Secondary | ICD-10-CM | POA: Diagnosis not present

## 2021-09-26 DIAGNOSIS — G8929 Other chronic pain: Secondary | ICD-10-CM

## 2021-09-26 DIAGNOSIS — M25562 Pain in left knee: Secondary | ICD-10-CM

## 2021-09-26 MED ORDER — CYCLOBENZAPRINE HCL 10 MG PO TABS: 10.0000 mg | ORAL_TABLET | Freq: Every day | ORAL | 0 refills | Status: AC

## 2021-09-26 NOTE — Telephone Encounter (Signed)
Spoke with the [place]  ?She states needing ov note on pt from 09/18/21 ?I verified the fax and sent to [place]  ?Nothing further needed ? ?

## 2021-09-26 NOTE — Progress Notes (Signed)
My right knee is no better. ? ?The left knee was helped with the injection last time but not the right knee.  He has more pain in the right knee and swelling and giving way.  I will get MRI.  I am concerned about tear medial meniscus and he may need arthroscopy. ? ?Right knee has effusion, crepitus, ROM 0 to 105, positive medial McMurray, limp right, NV intact, no distal edema, medial joint line pain. ? ?Encounter Diagnosis  ?Name Primary?  ? Bilateral chronic knee pain Yes  ? ?To get MRI of the right knee. ? ?Return in two weeks. ? ?Call if any problem. ? ?Precautions discussed. ? ?Electronically Signed ?Darreld Mclean, MD ?4/4/20233:10 PM ? ?

## 2021-10-02 ENCOUNTER — Ambulatory Visit: Payer: 59 | Admitting: Primary Care

## 2021-10-10 ENCOUNTER — Ambulatory Visit: Payer: 59 | Admitting: Orthopaedic Surgery

## 2021-10-13 ENCOUNTER — Ambulatory Visit: Payer: 59 | Admitting: Nurse Practitioner

## 2021-10-16 ENCOUNTER — Telehealth: Payer: Self-pay | Admitting: Radiology

## 2021-10-16 ENCOUNTER — Ambulatory Visit (HOSPITAL_COMMUNITY)
Admission: RE | Admit: 2021-10-16 | Discharge: 2021-10-16 | Disposition: A | Discharge status: Home / Self Care | Payer: 59 | Source: Ambulatory Visit | Attending: Orthopaedic Surgery | Admitting: Orthopaedic Surgery

## 2021-10-16 DIAGNOSIS — G8929 Other chronic pain: Secondary | ICD-10-CM | POA: Diagnosis present

## 2021-10-16 DIAGNOSIS — M25561 Pain in right knee: Secondary | ICD-10-CM | POA: Diagnosis present

## 2021-10-16 DIAGNOSIS — M25562 Pain in left knee: Secondary | ICD-10-CM | POA: Insufficient documentation

## 2021-10-16 IMAGING — MR MR KNEE*R* W/O CM
7 series · 40 of 40 positions shown · non-contrast
Comparison: X-ray knee [DATE].

CLINICAL DATA: Chronic right knee pain.

EXAM:
MRI OF THE RIGHT KNEE WITHOUT CONTRAST
TECHNIQUE: Multiplanar, multisequence MR imaging of the knee was performed. No
intravenous contrast was administered.

[Series 8: T2 fat-sat · axial · right · 4.0mm · 0.47mm/px · z∈[-81,+44]mm · 6 of 26 slices shown (1 of 3)]
[im 1/26]
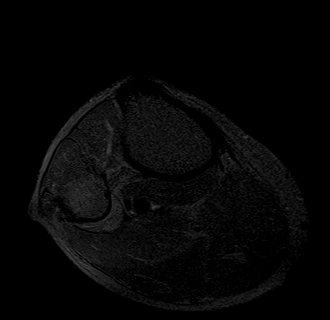
[im 6/26]
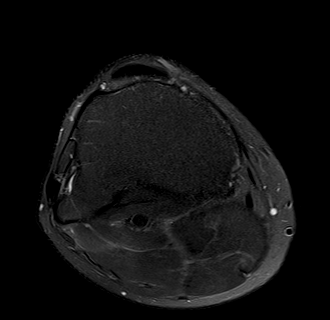
[im 11/26]
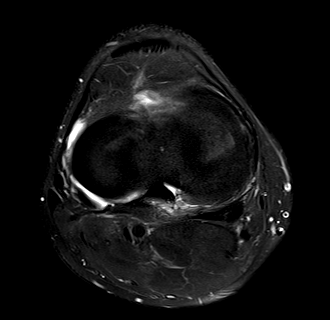
[im 16/26]
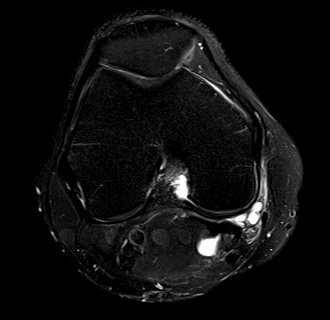
[im 21/26]
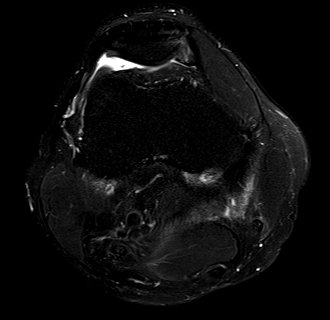
[im 26/26]
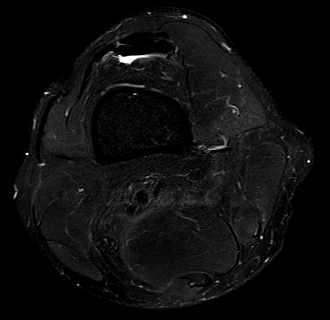

[Series 9: T1 · coronal · right · 4.0mm · 0.59mm/px · 5 of 24 slices shown]
[im 1/24]
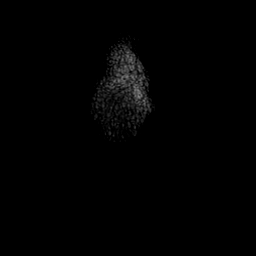
[im 6/24]
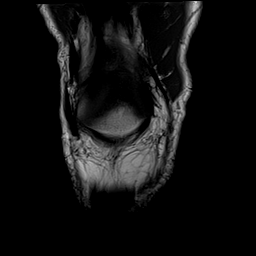
[im 12/24]
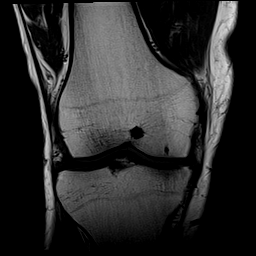
[im 18/24]
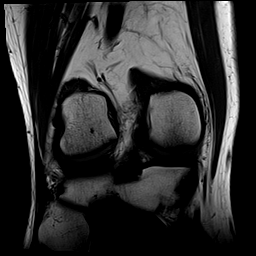
[im 24/24]
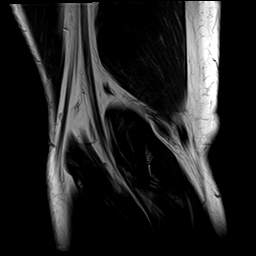

[Series 10: T2 fat-sat · coronal · right · 4.0mm · 0.59mm/px · 6 of 28 slices shown (2 of 3)]
[im 1/28]
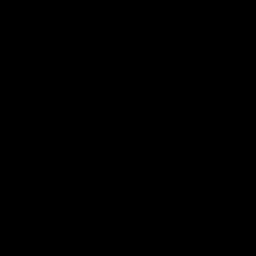
[im 6/28]
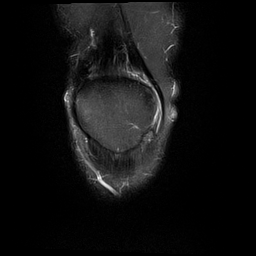
[im 11/28]
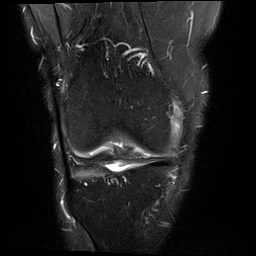
[im 17/28]
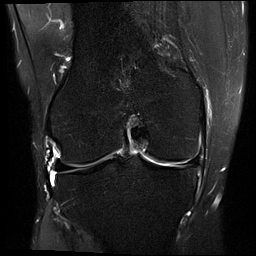
[im 22/28]
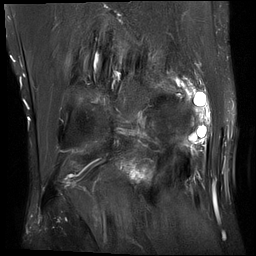
[im 28/28]
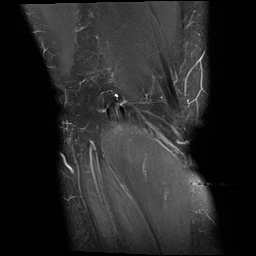

[Series 11: PD fat-sat · coronal · right · 4.0mm · 0.59mm/px · 6 of 27 slices shown (1 of 2)]
[im 1/27]
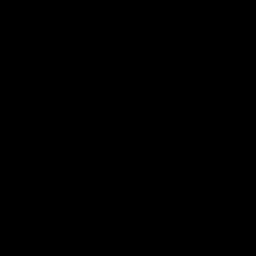
[im 6/27]
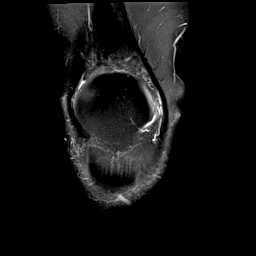
[im 11/27]
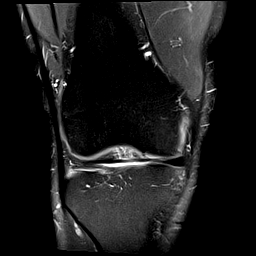
[im 16/27]
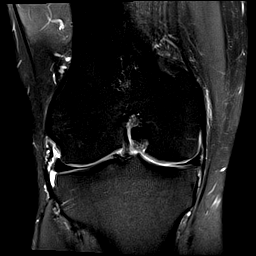
[im 21/27]
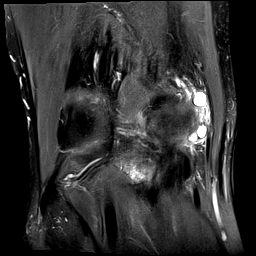
[im 27/27]
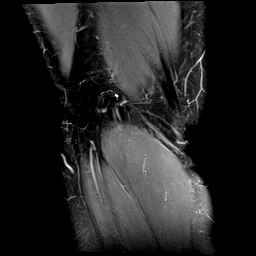

[Series 12: PD fat-sat · sagittal · right · 3.0mm · 0.52mm/px · 7 of 32 slices shown (2 of 2)]
[im 1/32]
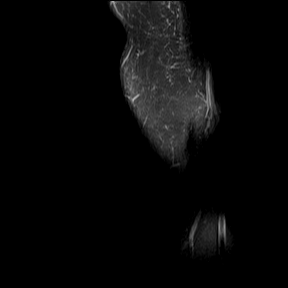
[im 6/32]
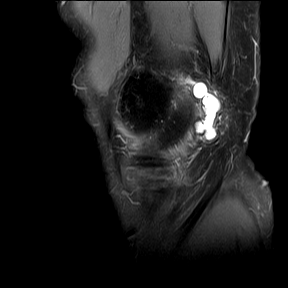
[im 11/32]
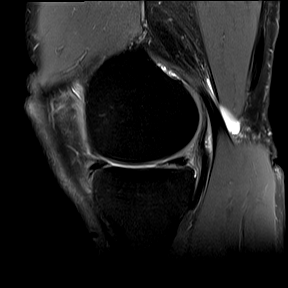
[im 16/32]
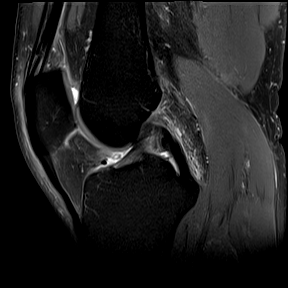
[im 21/32]
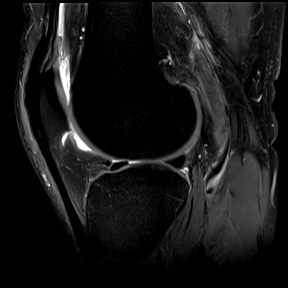
[im 26/32]
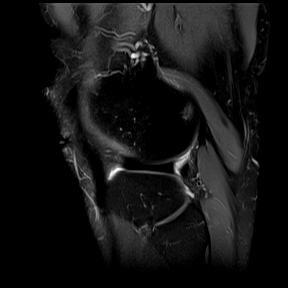
[im 32/32]
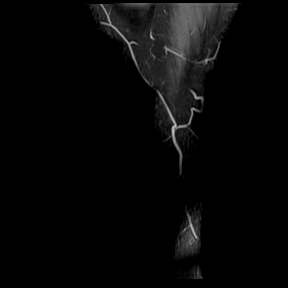

[Series 13: T2 fat-sat · sagittal · right · 3.0mm · 0.59mm/px · 7 of 32 slices shown (3 of 3)]
[im 1/32]
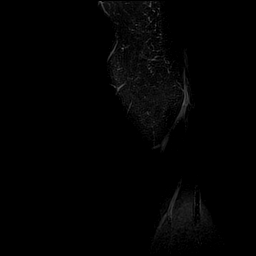
[im 6/32]
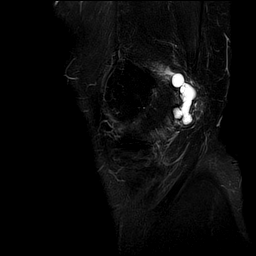
[im 11/32]
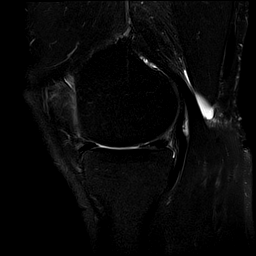
[im 16/32]
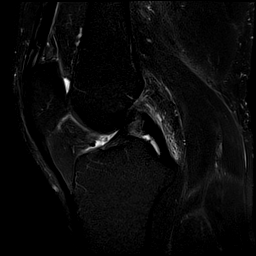
[im 21/32]
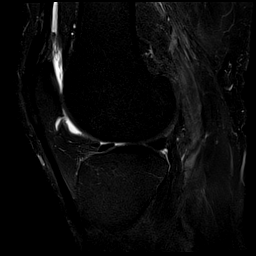
[im 26/32]
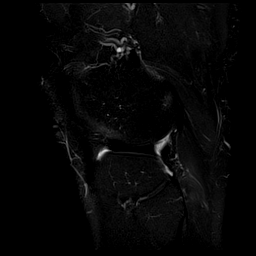
[im 32/32]
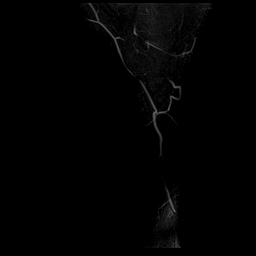

[Series 14: PD · coronal · right · 2.0mm · 0.47mm/px · 3 of 16 slices shown]
[im 1/16]
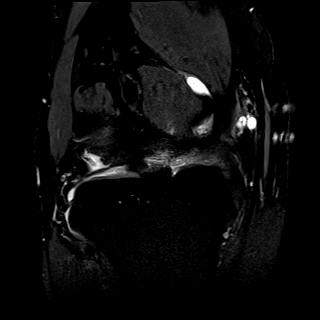
[im 8/16]
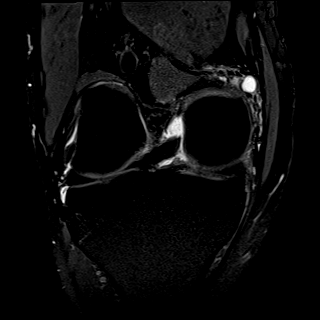
[im 16/16]
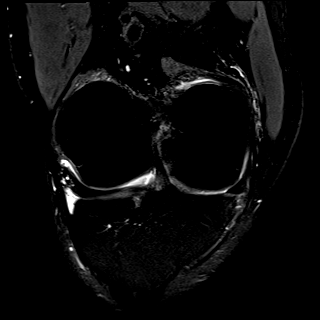

[40 of 40 positions shown; findings below may reference images not displayed]

FINDINGS: MENISCI

Medial meniscus: Oblique coursing inferior articular surface tear
involving the posterior horn and midbody junction region.

Lateral meniscus:  Intact

LIGAMENTS

Cruciates:  Intact

Collaterals:  Intact

CARTILAGE

Patellofemoral:  Normal articular cartilage.

Medial: Mild degenerative chondrosis with mild cartilage thinning
and early joint space narrowing.

Lateral:  Normal

Joint:  Small joint effusion.

Popliteal Fossa: Small Baker's cyst. This appears to communicate
with a multi septated cystic structure near the posterior medial
femoral condyle.

Extensor Mechanism: The patella retinacular structures are intact
and the quadriceps and patellar tendons are intact.

Bones:  No acute bony findings.

Other: Unremarkable knee musculature.
IMPRESSION: 1. Oblique coursing inferior articular surface tear involving the
posterior horn and midbody junction region of the medial meniscus.
2. Intact ligamentous structures and no acute bony findings.
3. Mild medial compartment degenerative chondrosis.
4. Small joint effusion and small Baker's cyst.

## 2021-10-16 NOTE — Telephone Encounter (Signed)
Carollee Herter called and LMVM asking how to get the medical records sent to patient's SSI lawyer.  I called back, LMVM advising to have the atty office to fax [place] a letter requesting records along with a signed release by patient.  Fax # [phone]. ?

## 2021-10-17 ENCOUNTER — Ambulatory Visit: Payer: 59 | Admitting: Orthopaedic Surgery

## 2021-10-24 ENCOUNTER — Ambulatory Visit (INDEPENDENT_AMBULATORY_CARE_PROVIDER_SITE_OTHER): Payer: 59 | Admitting: Orthopaedic Surgery

## 2021-10-24 ENCOUNTER — Encounter: Payer: Self-pay | Admitting: Orthopaedic Surgery

## 2021-10-24 DIAGNOSIS — S83241A Other tear of medial meniscus, current injury, right knee, initial encounter: Secondary | ICD-10-CM

## 2021-10-24 DIAGNOSIS — G8929 Other chronic pain: Secondary | ICD-10-CM

## 2021-10-24 DIAGNOSIS — M25561 Pain in right knee: Secondary | ICD-10-CM

## 2021-10-24 DIAGNOSIS — M25562 Pain in left knee: Secondary | ICD-10-CM

## 2021-10-24 NOTE — Progress Notes (Signed)
My other knee is hurting today. ? ?He has right knee pain with swelling and giving way.  He had MRI which showed: ?IMPRESSION: ?1. Oblique coursing inferior articular surface tear involving the ?posterior horn and midbody junction region of the medial meniscus. ?2. Intact ligamentous structures and no acute bony findings. ?3. Mild medial compartment degenerative chondrosis. ?4. Small joint effusion and small Baker's cyst. ?  ?I have explained the findings.  I will have him see Dr. Amedeo Kinsman or Dr. Aline Brochure for consideration of knee arthroscopy.  I have explained the procedure. ? ?I have independently reviewed the MRI.   ? ? ? ?He has more pain in the left knee now with swelling and also giving way.  He has more medial pain. ? ?Right knee has ROM 0 to 110, tender medially, effusion, crepitus, limp right, medial joint pain, positive medial McMurray, no distal edema, NV intact.  Left knee has ROM 0 to 115, medial joint pain, equivocal medial McMurray, NV intact. ? ?Encounter Diagnoses  ?Name Primary?  ? Bilateral chronic knee pain Yes  ? Acute tear medial meniscus, right, initial encounter   ? Chronic pain of left knee   ? ?I will have him see Dr. Amedeo Kinsman or Dr. Aline Brochure for possible arthroscopy of right knee. ? ?I will order MRI of the left knee. ? ?I will give knee brace for the right knee per his request.  He mows lawns and wants a brace. ? ?Call if any problem. ? ?Precautions discussed. ? ?Electronically Signed ?Sanjuana Kava, MD ?5/2/20233:12 PM ? ? ?

## 2021-11-08 ENCOUNTER — Ambulatory Visit: Payer: 59 | Admitting: Orthopedic Surgery

## 2021-11-16 ENCOUNTER — Ambulatory Visit: Payer: 59 | Admitting: Nurse Practitioner

## 2021-11-29 ENCOUNTER — Encounter: Payer: Self-pay | Admitting: Orthopedic Surgery

## 2021-11-29 ENCOUNTER — Ambulatory Visit (INDEPENDENT_AMBULATORY_CARE_PROVIDER_SITE_OTHER): Payer: 59 | Admitting: Orthopedic Surgery

## 2021-11-29 VITALS — BP 131/91 | HR 69 | Ht 74.0 in | Wt 229.6 lb

## 2021-11-29 DIAGNOSIS — G8929 Other chronic pain: Secondary | ICD-10-CM

## 2021-11-29 DIAGNOSIS — M25562 Pain in left knee: Secondary | ICD-10-CM

## 2021-11-29 DIAGNOSIS — I10 Essential (primary) hypertension: Secondary | ICD-10-CM

## 2021-11-29 DIAGNOSIS — M25561 Pain in right knee: Secondary | ICD-10-CM

## 2021-11-29 DIAGNOSIS — S83241A Other tear of medial meniscus, current injury, right knee, initial encounter: Secondary | ICD-10-CM

## 2021-11-29 DIAGNOSIS — Z01818 Encounter for other preprocedural examination: Secondary | ICD-10-CM

## 2021-11-29 NOTE — Patient Instructions (Addendum)
Meniscus Injury, Arthroscopy   Arthroscopy is a surgical procedure that involves the use of a small scope that has a camera and surgical instruments on the end (arthroscope). An arthroscope can be used to repair your meniscus injury.  LET YOUR HEALTH CARE PROVIDER KNOW ABOUT: Any allergies you have. All medicines you are taking, including vitamins, herbs, eyedrops, creams, and over-the-counter medicines. Any recent colds or infections you have had or currently have. Previous problems you or members of your family have had with the use of anesthetics. Any blood disorders or blood clotting problems you have. Previous surgeries you have had. Medical conditions you have. RISKS AND COMPLICATIONS Generally, this is a safe procedure. However, as with any procedure, problems can occur. Possible problems include: Damage to nerves or blood vessels. Excess bleeding. Blood clots. Infection. BEFORE THE PROCEDURE Do not eat or drink for 6-8 hours before the procedure. Take medicines as directed by your surgeon. Ask your surgeon about changing or stopping your regular medicines. You may have lab tests the morning of surgery. PROCEDURE  You will be given one of the following:  A medicine that numbs the area (local anesthesia). A medicine that makes you go to sleep (general anesthesia). A medicine injected into your spine that numbs your body below the waist (spinal anesthesia). Most often, several small cuts (incisions) are made in the knee. The arthroscope and instruments go into the incisions to repair the damage. The torn portion of the meniscus is removed.   AFTER THE PROCEDURE You will be taken to the recovery area where your progress will be monitored. When you are awake, stable, and taking fluids without complications, you will be allowed to go home. This is usually the same day. A torn or stretched ligament (ligament sprain) may take 6-8 weeks to heal.  It takes about the 4-6 WEEKS if your  surgeon removed a torn meniscus. A repaired meniscus may require 6-12 weeks of recovery time. A torn ligament needing reconstructive surgery may take 6-12 months to heal fully.   This information is not intended to replace advice given to you by your health care provider. Make sure you discuss any questions you have with your health care provider. You have decided to proceed with operative arthroscopy of the knee. You have decided not to continue with nonoperative measures such as but not limited to oral medication, weight loss, activity modification, physical therapy, bracing, or injection.  We will perform operative arthroscopy of the knee. Some of the risks associated with arthroscopic surgery of the knee include but are not limited to Bleeding Infection Swelling Stiffness Blood clot Pain Need for knee replacement surgery    In compliance with recent [place] law in federal regulation regarding opioid use and abuse and addiction, we will taper (stop) opioid medication after 2 weeks.  If you're not comfortable with these risks and would like to continue with nonoperative treatment please let Dr. Harrison know prior to your surgery.   Your surgery will be at [facility] by Dr Harrison  The hospital will contact you with a preoperative appointment to discuss Anesthesia. The phone number is [phone]  Please bring your medications with you for the appointment. They will tell you the arrival time and medication instructions when you have your preoperative evaluation. Do not wear nail polish the day of your surgery and if you take Phentermine you need to stop this medication ONE WEEK prior to your surgery.  

## 2021-11-29 NOTE — Addendum Note (Signed)
Addended byCaffie Damme on: 11/29/2021 02:21 PM   Modules accepted: Orders
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
## 2021-12-04 ENCOUNTER — Ambulatory Visit (HOSPITAL_COMMUNITY): Admission: RE | Admit: 2021-12-04 | Payer: 59 | Source: Ambulatory Visit

## 2021-12-19 ENCOUNTER — Telehealth: Payer: Self-pay | Admitting: Primary Care

## 2021-12-19 ENCOUNTER — Encounter: Payer: Self-pay | Admitting: Primary Care

## 2022-01-03 NOTE — Patient Instructions (Signed)
Richard Wagner  01/03/2022     @PREFPERIOPPHARMACY @   Your procedure is scheduled on  01/09/2022.   Report to [facility] at  [email or web address].   Call this number if you have problems the morning of surgery:  [phone]   Remember:  Do not eat or drink after midnight.      Take these medicines the morning of surgery with A SIP OF WATER               neurontin, antivert(if needed), zanaflex.     Do not wear jewelry, make-up or nail polish.  Do not wear lotions, powders, or perfumes, or deodorant.  Do not shave 48 hours prior to surgery.  Men may shave face and neck.  Do not bring valuables to the hospital.  [facility] is not responsible for any belongings or valuables.  Contacts, dentures or bridgework may not be worn into surgery.  Leave your suitcase in the car.  After surgery it may be brought to your room.  For patients admitted to the hospital, discharge time will be determined by your treatment team.  Patients discharged the day of surgery will not be allowed to drive home and must have someone with them for 24 hours.    Special instructions:   DO NOT smoke tobacco or vape for 24 hours before your procedure.  Please read over the following fact sheets that you were given. Coughing and Deep Breathing, Surgical Site Infection Prevention, Anesthesia Post-op Instructions, and Care and Recovery After Surgery      Arthroscopic Knee Ligament Repair, Care After This sheet gives you information about how to care for yourself after your procedure. Your health care provider may also give you more specific instructions. If you have problems or questions, contact your health care provider. What can I expect after the procedure? After the procedure, it is common to have: Soreness or pain in your knee. Bruising and swelling on your knee, calf, and ankle for 3-4 days. A small amount of fluid coming from the incisions. Follow these instructions at home: Medicines Take  over-the-counter and prescription medicines only as told by your health care provider. Ask your health care provider if the medicine prescribed to you: Requires you to avoid driving or using machinery. Can cause constipation. You may need to take these actions to prevent or treat constipation: Drink enough fluid to keep your urine pale yellow. Take over-the-counter or prescription medicines. Eat foods that are high in fiber, such as beans, whole grains, and fresh fruits and vegetables. Limit foods that are high in fat and processed sugars, such as fried or sweet foods. If you have a brace or immobilizer: Wear it as told by your health care provider. Remove it only as told by your health care provider. Loosen it if your toes tingle, become numb, or turn cold and blue. Keep it clean and dry. Ask your health care provider when it is safe to drive. Bathing Do not take baths, swim, or use a hot tub until your health care provider approves. Keep your bandage (dressing) dry until your health care provider says that it can be removed. If the brace or immobilizer is not waterproof: Do not let it get wet. Cover it with a watertight covering when you take a bath or shower. Incision care  Follow instructions from your health care provider about how to take care of your incisions. Make sure you: Wash your hands with soap  and water for at least 20 seconds before and after you change your dressing. If soap and water are not available, use hand sanitizer. Change your dressing as told by your health care provider. Leave stitches (sutures), skin glue, or adhesive strips in place. These skin closures may need to stay in place for 2 weeks or longer. If adhesive strip edges start to loosen and curl up, you may trim the loose edges. Do not remove adhesive strips completely unless your health care provider tells you to do that. Check your incision areas every day for signs of infection. Check for: Redness. More  swelling or pain. Blood or more fluid. Warmth. Pus or a bad smell. Managing pain, stiffness, and swelling  If directed, put ice on the affected area. To do this: If you have a removable brace or immobilizer, remove it as told by your health care provider. Put ice in a plastic bag. Place a towel between your skin and the bag. Leave the ice on for 20 minutes, 2-3 times a day. Remove the ice if your skin turns bright red. This is very important. If you cannot feel pain, heat, or cold, you have a greater risk of damage to the area. Move your toes often to reduce stiffness and swelling. Raise (elevate) the injured area above the level of your heart while you are sitting or lying down. Activity Do not use your knee to support your body weight until your health care provider says that you can. Use crutches or other devices as told by your health care provider. Do physical therapy exercises as told by your health care provider. Physical therapy will help you regain movement and strength in your knee. Follow instructions from your health care provider about: When you may start motion exercises. When you may start riding a stationary bike and doing other low-impact activities. When you may start to jog and do other high-impact activities. Do not lift anything that is heavier than 10 lb (4.5 kg), or the limit that you are told, until your health care provider says that it is safe. Ask your health care provider what activities are safe for you. General instructions Do not use any products that contain nicotine or tobacco, such as cigarettes, e-cigarettes, and chewing tobacco. These can delay healing. If you need help quitting, ask your health care provider. Wear compression stockings as told by your health care provider. These stockings help to prevent blood clots and reduce swelling in your legs. Keep all follow-up visits. This is important. Contact a health care provider if: You have any of these  signs of infection: Redness around an incision. Blood or more fluid coming from an incision. Warmth coming from an incision. Pus or a bad smell coming from an incision. More swelling or pain in your knee. A fever or chills. You have pain that does not get better with medicine. Your incision opens up. Get help right away if: You have trouble breathing. You have chest pain. You have increased pain or swelling in your calf or at the back of your knee. You have numbness and tingling near the knee joint or in the foot, ankle, or toes. You notice that your foot or toes look darker than normal or are cooler than normal. These symptoms may represent a serious problem that is an emergency. Do not wait to see if the symptoms will go away. Get medical help right away. Call your local emergency services (911 in the [place] Do not drive yourself to  the hospital. Summary After the procedure, it is common to have knee pain with bruising and swelling on your knee, calf, and ankle. Icing your knee and raising your leg above the level of your heart will help control the pain and swelling. Do physical therapy exercises as told by your health care provider. Physical therapy will help you regain movement and strength in your knee. This information is not intended to replace advice given to you by your health care provider. Make sure you discuss any questions you have with your health care provider. Document Revised: 11/09/2019 Document Reviewed: 11/09/2019 Elsevier Patient Education  [place] Anesthesia, Adult, Care After This sheet gives you information about how to care for yourself after your procedure. Your health care provider may also give you more specific instructions. If you have problems or questions, contact your health care provider. What can I expect after the procedure? After the procedure, the following side effects are common: Pain or discomfort at the IV  site. Nausea. Vomiting. Sore throat. Trouble concentrating. Feeling cold or chills. Feeling weak or tired. Sleepiness and fatigue. Soreness and body aches. These side effects can affect parts of the body that were not involved in surgery. Follow these instructions at home: For the time period you were told by your health care provider:  Rest. Do not participate in activities where you could fall or become injured. Do not drive or use machinery. Do not drink alcohol. Do not take sleeping pills or medicines that cause drowsiness. Do not make important decisions or sign legal documents. Do not take care of children on your own. Eating and drinking Follow any instructions from your health care provider about eating or drinking restrictions. When you feel hungry, start by eating small amounts of foods that are soft and easy to digest (bland), such as toast. Gradually return to your regular diet. Drink enough fluid to keep your urine pale yellow. If you vomit, rehydrate by drinking water, juice, or clear broth. General instructions If you have sleep apnea, surgery and certain medicines can increase your risk for breathing problems. Follow instructions from your health care provider about wearing your sleep device: Anytime you are sleeping, including during daytime naps. While taking prescription pain medicines, sleeping medicines, or medicines that make you drowsy. Have a responsible adult stay with you for the time you are told. It is important to have someone help care for you until you are awake and alert. Return to your normal activities as told by your health care provider. Ask your health care provider what activities are safe for you. Take over-the-counter and prescription medicines only as told by your health care provider. If you smoke, do not smoke without supervision. Keep all follow-up visits as told by your health care provider. This is important. Contact a health care  provider if: You have nausea or vomiting that does not get better with medicine. You cannot eat or drink without vomiting. You have pain that does not get better with medicine. You are unable to pass urine. You develop a skin rash. You have a fever. You have redness around your IV site that gets worse. Get help right away if: You have difficulty breathing. You have chest pain. You have blood in your urine or stool, or you vomit blood. Summary After the procedure, it is common to have a sore throat or nausea. It is also common to feel tired. Have a responsible adult stay with you for the time you are told.  It is important to have someone help care for you until you are awake and alert. When you feel hungry, start by eating small amounts of foods that are soft and easy to digest (bland), such as toast. Gradually return to your regular diet. Drink enough fluid to keep your urine pale yellow. Return to your normal activities as told by your health care provider. Ask your health care provider what activities are safe for you. This information is not intended to replace advice given to you by your health care provider. Make sure you discuss any questions you have with your health care provider. Document Revised: 02/25/2020 Document Reviewed: 09/24/2019 Elsevier Patient Education  2023 [employer] How to Use Chlorhexidine for Bathing Chlorhexidine gluconate (CHG) is a germ-killing (antiseptic) solution that is used to clean the skin. It can get rid of the bacteria that normally live on the skin and can keep them away for about 24 hours. To clean your skin with CHG, you may be given: A CHG solution to use in the shower or as part of a sponge bath. A prepackaged cloth that contains CHG. Cleaning your skin with CHG may help lower the risk for infection: While you are staying in the intensive care unit of the hospital. If you have a vascular access, such as a central line, to provide short-term or  long-term access to your veins. If you have a catheter to drain urine from your bladder. If you are on a ventilator. A ventilator is a machine that helps you breathe by moving air in and out of your lungs. After surgery. What are the risks? Risks of using CHG include: A skin reaction. Hearing loss, if CHG gets in your ears and you have a perforated eardrum. Eye injury, if CHG gets in your eyes and is not rinsed out. The CHG product catching fire. Make sure that you avoid smoking and flames after applying CHG to your skin. Do not use CHG: If you have a chlorhexidine allergy or have previously reacted to chlorhexidine. On babies younger than 93 months of age. How to use CHG solution Use CHG only as told by your health care provider, and follow the instructions on the label. Use the full amount of CHG as directed. Usually, this is one bottle. During a shower Follow these steps when using CHG solution during a shower (unless your health care provider gives you different instructions): Start the shower. Use your normal soap and shampoo to wash your face and hair. Turn off the shower or move out of the shower stream. Pour the CHG onto a clean washcloth. Do not use any type of brush or rough-edged sponge. Starting at your neck, lather your body down to your toes. Make sure you follow these instructions: If you will be having surgery, pay special attention to the part of your body where you will be having surgery. Scrub this area for at least 1 minute. Do not use CHG on your head or face. If the solution gets into your ears or eyes, rinse them well with water. Avoid your genital area. Avoid any areas of skin that have broken skin, cuts, or scrapes. Scrub your back and under your arms. Make sure to wash skin folds. Let the lather sit on your skin for 1-2 minutes or as long as told by your health care provider. Thoroughly rinse your entire body in the shower. Make sure that all body creases and  crevices are rinsed well. Dry off with a clean towel.  Do not put any substances on your body afterward--such as powder, lotion, or perfume--unless you are told to do so by your health care provider. Only use lotions that are recommended by the manufacturer. Put on clean clothes or pajamas. If it is the night before your surgery, sleep in clean sheets.  During a sponge bath Follow these steps when using CHG solution during a sponge bath (unless your health care provider gives you different instructions): Use your normal soap and shampoo to wash your face and hair. Pour the CHG onto a clean washcloth. Starting at your neck, lather your body down to your toes. Make sure you follow these instructions: If you will be having surgery, pay special attention to the part of your body where you will be having surgery. Scrub this area for at least 1 minute. Do not use CHG on your head or face. If the solution gets into your ears or eyes, rinse them well with water. Avoid your genital area. Avoid any areas of skin that have broken skin, cuts, or scrapes. Scrub your back and under your arms. Make sure to wash skin folds. Let the lather sit on your skin for 1-2 minutes or as long as told by your health care provider. Using a different clean, wet washcloth, thoroughly rinse your entire body. Make sure that all body creases and crevices are rinsed well. Dry off with a clean towel. Do not put any substances on your body afterward--such as powder, lotion, or perfume--unless you are told to do so by your health care provider. Only use lotions that are recommended by the manufacturer. Put on clean clothes or pajamas. If it is the night before your surgery, sleep in clean sheets. How to use CHG prepackaged cloths Only use CHG cloths as told by your health care provider, and follow the instructions on the label. Use the CHG cloth on clean, dry skin. Do not use the CHG cloth on your head or face unless your health  care provider tells you to. When washing with the CHG cloth: Avoid your genital area. Avoid any areas of skin that have broken skin, cuts, or scrapes. Before surgery Follow these steps when using a CHG cloth to clean before surgery (unless your health care provider gives you different instructions): Using the CHG cloth, vigorously scrub the part of your body where you will be having surgery. Scrub using a back-and-forth motion for 3 minutes. The area on your body should be completely wet with CHG when you are done scrubbing. Do not rinse. Discard the cloth and let the area air-dry. Do not put any substances on the area afterward, such as powder, lotion, or perfume. Put on clean clothes or pajamas. If it is the night before your surgery, sleep in clean sheets.  For general bathing Follow these steps when using CHG cloths for general bathing (unless your health care provider gives you different instructions). Use a separate CHG cloth for each area of your body. Make sure you wash between any folds of skin and between your fingers and toes. Wash your body in the following order, switching to a new cloth after each step: The front of your neck, shoulders, and chest. Both of your arms, under your arms, and your hands. Your stomach and groin area, avoiding the genitals. Your right leg and foot. Your left leg and foot. The back of your neck, your back, and your buttocks. Do not rinse. Discard the cloth and let the area air-dry. Do not put  any substances on your body afterward--such as powder, lotion, or perfume--unless you are told to do so by your health care provider. Only use lotions that are recommended by the manufacturer. Put on clean clothes or pajamas. Contact a health care provider if: Your skin gets irritated after scrubbing. You have questions about using your solution or cloth. You swallow any chlorhexidine. Call your local poison control center (1-325-048-3796 in the [place] Get help  right away if: Your eyes itch badly, or they become very red or swollen. Your skin itches badly and is red or swollen. Your hearing changes. You have trouble seeing. You have swelling or tingling in your mouth or throat. You have trouble breathing. These symptoms may represent a serious problem that is an emergency. Do not wait to see if the symptoms will go away. Get medical help right away. Call your local emergency services (911 in the [place] Do not drive yourself to the hospital. Summary Chlorhexidine gluconate (CHG) is a germ-killing (antiseptic) solution that is used to clean the skin. Cleaning your skin with CHG may help to lower your risk for infection. You may be given CHG to use for bathing. It may be in a bottle or in a prepackaged cloth to use on your skin. Carefully follow your health care provider's instructions and the instructions on the product label. Do not use CHG if you have a chlorhexidine allergy. Contact your health care provider if your skin gets irritated after scrubbing. This information is not intended to replace advice given to you by your health care provider. Make sure you discuss any questions you have with your health care provider. Document Revised: 08/22/2020 Document Reviewed: 08/22/2020 Elsevier Patient Education  [place].

## 2022-01-05 ENCOUNTER — Encounter (HOSPITAL_COMMUNITY)
Admission: RE | Admit: 2022-01-05 | Discharge: 2022-01-05 | Disposition: A | Discharge status: Home / Self Care | Payer: 59 | Source: Ambulatory Visit | Attending: Orthopedic Surgery | Admitting: Orthopedic Surgery

## 2022-01-05 ENCOUNTER — Encounter (HOSPITAL_COMMUNITY): Payer: Self-pay

## 2022-01-05 ENCOUNTER — Other Ambulatory Visit: Payer: Self-pay | Admitting: Orthopedic Surgery

## 2022-01-05 ENCOUNTER — Other Ambulatory Visit: Payer: Self-pay

## 2022-01-05 ENCOUNTER — Telehealth: Payer: Self-pay | Admitting: Radiology

## 2022-01-05 DIAGNOSIS — Z01818 Encounter for other preprocedural examination: Secondary | ICD-10-CM | POA: Insufficient documentation

## 2022-01-05 DIAGNOSIS — X58XXXA Exposure to other specified factors, initial encounter: Secondary | ICD-10-CM | POA: Insufficient documentation

## 2022-01-05 DIAGNOSIS — I10 Essential (primary) hypertension: Secondary | ICD-10-CM | POA: Insufficient documentation

## 2022-01-05 DIAGNOSIS — S83241A Other tear of medial meniscus, current injury, right knee, initial encounter: Secondary | ICD-10-CM | POA: Insufficient documentation

## 2022-01-05 LAB — BASIC METABOLIC PANEL
Anion gap: 6 (ref 5–15)
BUN: 18 mg/dL (ref 6–20)
CO2: 22 mmol/L (ref 22–32)
Calcium: 9.4 mg/dL (ref 8.9–10.3)
Chloride: 111 mmol/L (ref 98–111)
Creatinine, Ser: 1.07 mg/dL (ref 0.61–1.24)
GFR, Estimated: >60 mL/min (ref >60)
Glucose, Bld: 90 mg/dL (ref 70–99)
Potassium: 4 mmol/L (ref 3.5–5.1)
Sodium: 139 mmol/L (ref 135–145)

## 2022-01-05 LAB — CBC WITH DIFFERENTIAL/PLATELET
Abs Immature Granulocytes: 0.02 10*3/uL (ref 0.00–0.07)
Basophils Absolute: 0.1 10*3/uL (ref 0.0–0.1)
Basophils Relative: 1 %
Eosinophils Absolute: 0.3 10*3/uL (ref 0.0–0.5)
Eosinophils Relative: 4 %
HCT: 45.1 % (ref 39.0–52.0)
Hemoglobin: 15.6 g/dL (ref 13.0–17.0)
Immature Granulocytes: 0 %
Lymphocytes Relative: 27 %
Lymphs Abs: 1.9 10*3/uL (ref 0.7–4.0)
MCH: 31.6 pg (ref 26.0–34.0)
MCHC: 34.6 g/dL (ref 30.0–36.0)
MCV: 91.3 fL (ref 80.0–100.0)
Monocytes Absolute: 0.7 10*3/uL (ref 0.1–1.0)
Monocytes Relative: 10 %
Neutro Abs: 4.2 10*3/uL (ref 1.7–7.7)
Neutrophils Relative %: 58 %
Platelets: 207 10*3/uL (ref 150–400)
RBC: 4.94 MIL/uL (ref 4.22–5.81)
RDW: 12.7 % (ref 11.5–15.5)
WBC: 7.2 10*3/uL (ref 4.0–10.5)
nRBC: 0 % (ref 0.0–0.2)

## 2022-01-05 NOTE — Telephone Encounter (Signed)
I called patient to let him know I have requested clearance for his surgery twice on 12/13/21 and on 01/03/22 and they have not responded  He will call them to let them know it needs to be approved I told him I will call too.  I did call and the surgery is approved  Valid dated 12/13/21 to 03/15/22 Auth number is [id number]
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
## 2022-01-09 ENCOUNTER — Ambulatory Visit (HOSPITAL_COMMUNITY)
Admission: RE | Admit: 2022-01-09 | Discharge: 2022-01-09 | Disposition: A | Discharge status: Home / Self Care | Payer: 59 | Attending: Orthopedic Surgery | Admitting: Orthopedic Surgery

## 2022-01-09 ENCOUNTER — Ambulatory Visit (HOSPITAL_BASED_OUTPATIENT_CLINIC_OR_DEPARTMENT_OTHER): Payer: 59 | Admitting: Anesthesiology

## 2022-01-09 ENCOUNTER — Encounter (HOSPITAL_COMMUNITY): Payer: Self-pay | Admitting: Orthopedic Surgery

## 2022-01-09 ENCOUNTER — Other Ambulatory Visit: Payer: Self-pay

## 2022-01-09 ENCOUNTER — Encounter (HOSPITAL_COMMUNITY)
Admission: RE | Disposition: A | Discharge status: Home / Self Care | Payer: Self-pay | Source: Home / Self Care | Attending: Orthopedic Surgery

## 2022-01-09 ENCOUNTER — Ambulatory Visit (HOSPITAL_COMMUNITY): Payer: 59 | Admitting: Anesthesiology

## 2022-01-09 DIAGNOSIS — Z87891 Personal history of nicotine dependence: Secondary | ICD-10-CM | POA: Diagnosis not present

## 2022-01-09 DIAGNOSIS — M1711 Unilateral primary osteoarthritis, right knee: Secondary | ICD-10-CM | POA: Diagnosis not present

## 2022-01-09 DIAGNOSIS — G4733 Obstructive sleep apnea (adult) (pediatric): Secondary | ICD-10-CM

## 2022-01-09 DIAGNOSIS — S83241A Other tear of medial meniscus, current injury, right knee, initial encounter: Secondary | ICD-10-CM | POA: Diagnosis not present

## 2022-01-09 DIAGNOSIS — I10 Essential (primary) hypertension: Secondary | ICD-10-CM | POA: Diagnosis not present

## 2022-01-09 DIAGNOSIS — I6990 Unspecified sequelae of unspecified cerebrovascular disease: Secondary | ICD-10-CM | POA: Insufficient documentation

## 2022-01-09 DIAGNOSIS — Z79891 Long term (current) use of opiate analgesic: Secondary | ICD-10-CM | POA: Diagnosis not present

## 2022-01-09 DIAGNOSIS — G4730 Sleep apnea, unspecified: Secondary | ICD-10-CM | POA: Diagnosis not present

## 2022-01-09 DIAGNOSIS — X58XXXA Exposure to other specified factors, initial encounter: Secondary | ICD-10-CM | POA: Insufficient documentation

## 2022-01-09 DIAGNOSIS — S83241D Other tear of medial meniscus, current injury, right knee, subsequent encounter: Secondary | ICD-10-CM

## 2022-01-09 DIAGNOSIS — G8929 Other chronic pain: Secondary | ICD-10-CM

## 2022-01-09 HISTORY — PX: KNEE ARTHROSCOPY WITH MEDIAL MENISECTOMY: SHX5651

## 2022-01-09 LAB — GLUCOSE, CAPILLARY: Glucose-Capillary: 122 mg/dL — ABNORMAL HIGH (ref 70–99)

## 2022-01-09 SURGERY — ARTHROSCOPY, KNEE, WITH MEDIAL MENISCECTOMY
Anesthesia: General | Site: Knee | Laterality: Right

## 2022-01-09 MED ORDER — HYDROMORPHONE HCL 1 MG/ML IJ SOLN
0.2500 mg | INTRAMUSCULAR | Status: DC | PRN
  Administered 2022-01-09 (×2): 0.5 mg via INTRAVENOUS
  Filled 2022-01-09 (×2): qty 0.5

## 2022-01-09 MED ORDER — SCOPOLAMINE 1 MG/3DAYS TD PT72
MEDICATED_PATCH | TRANSDERMAL | Status: DC
Start: 2022-01-09 — End: 2022-01-09
  Filled 2022-01-09: qty 1

## 2022-01-09 MED ORDER — ORAL CARE MOUTH RINSE: 15.0000 mL | Freq: Once | OROMUCOSAL | Status: AC

## 2022-01-09 MED ORDER — METOCLOPRAMIDE HCL 5 MG/ML IJ SOLN
INTRAMUSCULAR | Status: DC | PRN
  Administered 2022-01-09: 10 mg via INTRAVENOUS

## 2022-01-09 MED ORDER — BUPIVACAINE-EPINEPHRINE (PF) 0.5% -1:200000 IJ SOLN
INTRAMUSCULAR | Status: DC | PRN
  Administered 2022-01-09: 60 mL via PERINEURAL

## 2022-01-09 MED ORDER — CEFAZOLIN SODIUM-DEXTROSE 2-4 GM/100ML-% IV SOLN
INTRAVENOUS | Status: AC
  Filled 2022-01-09: qty 100

## 2022-01-09 MED ORDER — CHLORHEXIDINE GLUCONATE 0.12 % MT SOLN
15.0000 mL | Freq: Once | OROMUCOSAL | Status: AC
  Administered 2022-01-09: 15 mL via OROMUCOSAL

## 2022-01-09 MED ORDER — PROPOFOL 10 MG/ML IV BOLUS
INTRAVENOUS | Status: DC | PRN
  Administered 2022-01-09: 200 mg via INTRAVENOUS

## 2022-01-09 MED ORDER — ONDANSETRON HCL 4 MG/2ML IJ SOLN
4.0000 mg | Freq: Once | INTRAMUSCULAR | Status: DC | PRN
  Filled 2022-01-09: qty 2

## 2022-01-09 MED ORDER — DEXAMETHASONE SODIUM PHOSPHATE 10 MG/ML IJ SOLN
INTRAMUSCULAR | Status: DC | PRN
  Administered 2022-01-09: 10 mg via INTRAVENOUS

## 2022-01-09 MED ORDER — SCOPOLAMINE 1 MG/3DAYS TD PT72
1.0000 | MEDICATED_PATCH | Freq: Once | TRANSDERMAL | Status: DC
  Administered 2022-01-09: 1.5 mg via TRANSDERMAL

## 2022-01-09 MED ORDER — LACTATED RINGERS IV SOLN
INTRAVENOUS | Status: DC
Start: 2022-01-09 — End: 2022-01-09

## 2022-01-09 MED ORDER — IBUPROFEN 800 MG PO TABS
ORAL_TABLET | ORAL | Status: AC
  Filled 2022-01-09: qty 1

## 2022-01-09 MED ORDER — DEXAMETHASONE SODIUM PHOSPHATE 4 MG/ML IJ SOLN
INTRAMUSCULAR | Status: AC
  Filled 2022-01-09: qty 2

## 2022-01-09 MED ORDER — SODIUM CHLORIDE 0.9 % IR SOLN
Status: DC | PRN
  Administered 2022-01-09 (×2): 3000 mL

## 2022-01-09 MED ORDER — CEFAZOLIN SODIUM-DEXTROSE 2-4 GM/100ML-% IV SOLN
2.0000 g | INTRAVENOUS | Status: AC
  Administered 2022-01-09: 2 g via INTRAVENOUS

## 2022-01-09 MED ORDER — PROPOFOL 10 MG/ML IV BOLUS
INTRAVENOUS | Status: AC
  Filled 2022-01-09: qty 20

## 2022-01-09 MED ORDER — ONDANSETRON HCL 4 MG/2ML IJ SOLN
INTRAMUSCULAR | Status: DC | PRN
  Administered 2022-01-09: 4 mg via INTRAVENOUS

## 2022-01-09 MED ORDER — FENTANYL CITRATE (PF) 250 MCG/5ML IJ SOLN
INTRAMUSCULAR | Status: AC
  Filled 2022-01-09: qty 5

## 2022-01-09 MED ORDER — FENTANYL CITRATE (PF) 100 MCG/2ML IJ SOLN
INTRAMUSCULAR | Status: DC | PRN
  Administered 2022-01-09: 25 ug via INTRAVENOUS

## 2022-01-09 MED ORDER — MIDAZOLAM HCL 5 MG/5ML IJ SOLN
INTRAMUSCULAR | Status: DC | PRN
  Administered 2022-01-09: 2 mg via INTRAVENOUS

## 2022-01-09 MED ORDER — ONDANSETRON HCL 4 MG/2ML IJ SOLN
INTRAMUSCULAR | Status: AC
  Filled 2022-01-09: qty 2

## 2022-01-09 MED ORDER — LIDOCAINE HCL (PF) 2 % IJ SOLN
INTRAMUSCULAR | Status: AC
  Filled 2022-01-09: qty 5

## 2022-01-09 MED ORDER — IBUPROFEN 800 MG PO TABS
800.0000 mg | ORAL_TABLET | Freq: Once | ORAL | Status: AC
  Administered 2022-01-09: 800 mg via ORAL

## 2022-01-09 MED ORDER — MEPERIDINE HCL 50 MG/ML IJ SOLN: 6.2500 mg | INTRAMUSCULAR | Status: DC | PRN

## 2022-01-09 MED ORDER — BUPIVACAINE-EPINEPHRINE (PF) 0.5% -1:200000 IJ SOLN
INTRAMUSCULAR | Status: AC
  Filled 2022-01-09: qty 60

## 2022-01-09 MED ORDER — METOCLOPRAMIDE HCL 5 MG/ML IJ SOLN
INTRAMUSCULAR | Status: AC
  Filled 2022-01-09: qty 2

## 2022-01-09 MED ORDER — MIDAZOLAM HCL 2 MG/2ML IJ SOLN
INTRAMUSCULAR | Status: AC
  Filled 2022-01-09: qty 2

## 2022-01-09 MED ORDER — DEXAMETHASONE SODIUM PHOSPHATE 10 MG/ML IJ SOLN
INTRAMUSCULAR | Status: AC
  Filled 2022-01-09: qty 1

## 2022-01-09 MED ORDER — SCOPOLAMINE 1 MG/3DAYS TD PT72
MEDICATED_PATCH | TRANSDERMAL | Status: AC
  Filled 2022-01-09: qty 1

## 2022-01-09 MED ORDER — ONDANSETRON HCL 4 MG/2ML IJ SOLN: 4.0000 mg | Freq: Once | INTRAMUSCULAR | Status: DC

## 2022-01-09 MED ORDER — LIDOCAINE 2% (20 MG/ML) 5 ML SYRINGE
INTRAMUSCULAR | Status: DC | PRN
  Administered 2022-01-09: 100 mg via INTRAVENOUS

## 2022-01-09 SURGICAL SUPPLY — 49 items
APL PRP STRL LF DISP 70% ISPRP (MISCELLANEOUS) ×1
BAG HAMPER (MISCELLANEOUS) ×2 IMPLANT
BLADE SHAVER TORPEDO 4X13 (MISCELLANEOUS) ×1 IMPLANT
BLADE SURG SZ11 CARB STEEL (BLADE) ×2 IMPLANT
BNDG CMPR STD VLCR NS LF 5.8X6 (GAUZE/BANDAGES/DRESSINGS) ×1
BNDG ELASTIC 6X5.8 VLCR NS LF (GAUZE/BANDAGES/DRESSINGS) ×2 IMPLANT
CHLORAPREP W/TINT 26 (MISCELLANEOUS) ×2 IMPLANT
CLOTH BEACON ORANGE TIMEOUT ST (SAFETY) ×2 IMPLANT
COOLER ICEMAN CLASSIC (MISCELLANEOUS) ×2 IMPLANT
CUFF TOURN SGL QUICK 34 (TOURNIQUET CUFF) ×2
CUFF TRNQT CYL 34X4.125X (TOURNIQUET CUFF) IMPLANT
DECANTER SPIKE VIAL GLASS SM (MISCELLANEOUS) ×4 IMPLANT
DRAPE HALF SHEET 40X57 (DRAPES) ×2 IMPLANT
GAUZE 4X4 16PLY ~~LOC~~+RFID DBL (SPONGE) ×2 IMPLANT
GAUZE SPONGE 4X4 12PLY STRL (GAUZE/BANDAGES/DRESSINGS) ×2 IMPLANT
GAUZE SPONGE 4X4 16PLY XRAY LF (GAUZE/BANDAGES/DRESSINGS) ×2 IMPLANT
GAUZE XEROFORM 5X9 LF (GAUZE/BANDAGES/DRESSINGS) ×2 IMPLANT
GLOVE BIOGEL PI IND STRL 7.0 (GLOVE) ×2 IMPLANT
GLOVE BIOGEL PI INDICATOR 7.0 (GLOVE) ×2
GLOVE SS N UNI LF 8.5 STRL (GLOVE) ×2 IMPLANT
GLOVE SURG POLYISO LF SZ8 (GLOVE) ×2 IMPLANT
GOWN STRL REUS W/TWL LRG LVL3 (GOWN DISPOSABLE) ×2 IMPLANT
GOWN STRL REUS W/TWL XL LVL3 (GOWN DISPOSABLE) ×2 IMPLANT
IV NS IRRIG 3000ML ARTHROMATIC (IV SOLUTION) ×4 IMPLANT
KIT BLADEGUARD II DBL (SET/KITS/TRAYS/PACK) ×2 IMPLANT
KIT TURNOVER CYSTO (KITS) ×2 IMPLANT
MANIFOLD NEPTUNE II (INSTRUMENTS) ×2 IMPLANT
MARKER SKIN DUAL TIP RULER LAB (MISCELLANEOUS) ×2 IMPLANT
NDL HYPO 18GX1.5 BLUNT FILL (NEEDLE) ×1 IMPLANT
NDL HYPO 21X1.5 SAFETY (NEEDLE) ×1 IMPLANT
NDL SPNL 18GX3.5 QUINCKE PK (NEEDLE) ×1 IMPLANT
NEEDLE HYPO 18GX1.5 BLUNT FILL (NEEDLE) ×2 IMPLANT
NEEDLE HYPO 21X1.5 SAFETY (NEEDLE) ×2 IMPLANT
NEEDLE SPNL 18GX3.5 QUINCKE PK (NEEDLE) ×2 IMPLANT
NS IRRIG 1000ML POUR BTL (IV SOLUTION) ×2 IMPLANT
PACK ARTHRO LIMB DRAPE STRL (MISCELLANEOUS) ×2 IMPLANT
PACK ARTHROSCOPY WL (CUSTOM PROCEDURE TRAY) ×2 IMPLANT
PAD ABD 5X9 TENDERSORB (GAUZE/BANDAGES/DRESSINGS) ×2 IMPLANT
PAD ARMBOARD 7.5X6 YLW CONV (MISCELLANEOUS) ×2 IMPLANT
PAD COLD SHLDR SM WRAP-ON (PAD) ×1 IMPLANT
PAD FOR LEG HOLDER (MISCELLANEOUS) ×2 IMPLANT
PADDING CAST COTTON 6X4 STRL (CAST SUPPLIES) ×2 IMPLANT
SET ARTHROSCOPY INST (INSTRUMENTS) ×2 IMPLANT
SET BASIN LINEN APH (SET/KITS/TRAYS/PACK) ×2 IMPLANT
SUT ETHILON 3 0 FSL (SUTURE) ×2 IMPLANT
SYR 10ML LL (SYRINGE) ×2 IMPLANT
SYR 30ML LL (SYRINGE) ×4 IMPLANT
TUBE CONNECTING 12X1/4 (SUCTIONS) ×4 IMPLANT
TUBING IN/OUT FLOW W/MAIN PUMP (TUBING) ×2 IMPLANT

## 2022-01-09 NOTE — Anesthesia Postprocedure Evaluation (Signed)
Anesthesia Post Note  Patient: Spike Desilets  Procedure(s) Performed: KNEE ARTHROSCOPY WITH MEDIAL MENISCECTOMY (Right: Knee)  Patient location during evaluation: Phase II Anesthesia Type: General Level of consciousness: awake and alert and oriented Pain management: pain level controlled Vital Signs Assessment: post-procedure vital signs reviewed and stable Respiratory status: spontaneous breathing, nonlabored ventilation and respiratory function stable Cardiovascular status: blood pressure returned to baseline and stable Postop Assessment: no apparent nausea or vomiting Anesthetic complications: no   No notable events documented.   Last Vitals:  Vitals:   01/09/22 1200 01/09/22 1209  BP: (!) 139/97 (!) 127/98  Pulse: 74 76  Resp: 11 14  Temp:  36.6 C  SpO2: 96% 96%    Last Pain:  Vitals:   01/09/22 1209  TempSrc: Oral  PainSc: 0-No pain                 Oluwadarasimi Redmon C Trung Wenzl

## 2022-01-09 NOTE — Op Note (Signed)
01/09/2022  10:15 AM  Knee arthroscopy dictation  Preop diagnosis torn medial meniscus right knee  Postop diagnosis same  Procedure arthroscopy arthroscopy partial medial meniscectomy  Surgeon Brook Geraci  Operative findings  MEDIAL - meniscus posterior horn tear medial meniscus primarily inferior surface -articular surface grade 2 diffuse chondromalacia  LATERAL - meniscus normal -articular surface normal  PTF   -articular surface normal  NOTCH  -acl normal -pcl normal     The patient was identified in the preoperative holding area using 2 approved identification mechanisms. The chart was reviewed and updated. The surgical site was confirmed as right knee and marked with an indelible marker.  The patient was taken to the operating room for anesthesia. After successful LMA general anesthesia, Ancef 2 g was used as IV antibiotics.  The patient was placed in the supine position with the (right) the operative extremity in an arthroscopic leg holder and the opposite extremity in a padded leg holder.  The timeout was executed.  A lateral portal was established with an 11 blade and the scope was introduced into the joint. A diagnostic arthroscopy was performed in circumferential manner examining the entire knee joint. A medial portal was established and the diagnostic arthroscopy was repeated using a probe to palpate intra-articular structures as they were encountered.    The medial meniscus was resected using a duckbill forceps. The meniscal fragments were removed with a motorized shaver. The meniscus was balanced with a motorized shaver until a stable rim was obtained care was taken to thoroughly evaluate the remaining meniscus   The arthroscopic pump was placed on the wash mode and any excess debris was removed from the joint using suction.  60 cc of Marcaine with epinephrine was injected through the arthroscope.  The portals were closed with 3-0 nylon suture.  A  sterile bandage, Ace wrap and Cryo/Cuff was placed and the Cryo/Cuff was activated. The patient was taken to the recovery room in stable condition.  PHYSICIAN ASSISTANT: no  ASSISTANTS: none   ANESTHESIA:   LMA general  EBL:  none   BLOOD ADMINISTERED:none  DRAINS: none   LOCAL MEDICATIONS USED:  MARCAINE     SPECIMEN:  No Specimen  DISPOSITION OF SPECIMEN:  N/A  COUNTS:  YES   DICTATION: .Dragon Dictation  PLAN OF CARE: Discharge to home after PACU  PATIENT DISPOSITION:  PACU - hemodynamically stable.   Delay start of Pharmacological VTE agent (>24hrs) due to surgical blood loss or risk of bleeding: not applicable  POST OP PLAN  WB as tolerated SUTURES OUT IN A WEEK  AROM  BRACE none needed  01/09/2022  10:14 AM  PATIENT:  Richard Wagner  56 y.o. male  PRE-OPERATIVE DIAGNOSIS:  Torn medial meniscus right knee  POST-OPERATIVE DIAGNOSIS:  Torn medial meniscus right knee  PROCEDURE:  Procedure(s): KNEE ARTHROSCOPY WITH MEDIAL MENISCECTOMY (Right)  SURGEON:  Surgeon(s) and Role:    * Stephanye Finnicum E, MD - Primary  PHYSICIAN ASSISTANT:   ASSISTANTS: None  ANESTHESIA:   LMA general  EBL:  20 mL   BLOOD ADMINISTERED: None  DRAINS: None  LOCAL MEDICATIONS USED: Marcaine with epinephrine  SPECIMEN: None  DISPOSITION OF SPECIMEN: None  COUNTS: Correct  TOURNIQUET:  * Missing tourniquet times found for documented tourniquets in log: [id number] *  DICTATION: .Dragon  PLAN OF CARE: Discharge to home  PATIENT DISPOSITION: Stable   Delay start of Pharmacological VTE agent (>24hrs) due to surgical blood loss or risk of bleeding: Not applicable  

## 2022-01-09 NOTE — Anesthesia Preprocedure Evaluation (Addendum)
Anesthesia Evaluation  Patient identified by MRN, date of birth, ID band Patient awake    Reviewed: Allergy & Precautions, NPO status , Patient's Chart, lab work & pertinent test results  History of Anesthesia Complications (+) PONV and history of anesthetic complications  Airway Mallampati: II  TM Distance: >3 FB Neck ROM: Full    Dental  (+) Dental Advisory Given, Teeth Intact   Pulmonary sleep apnea and Continuous Positive Airway Pressure Ventilation , former smoker,    Pulmonary exam normal breath sounds clear to auscultation       Cardiovascular hypertension, Pt. on medications Normal cardiovascular exam Rhythm:Regular Rate:Normal     Neuro/Psych CVA, Residual Symptoms negative psych ROS   GI/Hepatic negative GI ROS, Neg liver ROS,   Endo/Other  negative endocrine ROS  Renal/GU negative Renal ROS  negative genitourinary   Musculoskeletal  (+) Arthritis , Osteoarthritis,    Abdominal   Peds negative pediatric ROS (+)  Hematology negative hematology ROS (+)   Anesthesia Other Findings   Reproductive/Obstetrics negative OB ROS                            Anesthesia Physical Anesthesia Plan  ASA: 3  Anesthesia Plan: General   Post-op Pain Management: Dilaudid IV   Induction: Intravenous  PONV Risk Score and Plan: 3 and Ondansetron, Dexamethasone, Metaclopromide and Scopolamine patch - Pre-op  Airway Management Planned: LMA  Additional Equipment:   Intra-op Plan:   Post-operative Plan: Extubation in OR  Informed Consent: I have reviewed the patients History and Physical, chart, labs and discussed the procedure including the risks, benefits and alternatives for the proposed anesthesia with the patient or authorized representative who has indicated his/her understanding and acceptance.       Plan Discussed with: CRNA and Surgeon  Anesthesia Plan Comments:         Anesthesia Quick Evaluation

## 2022-01-09 NOTE — Transfer of Care (Signed)
Immediate Anesthesia Transfer of Care Note  Patient: Sharmarke Cicio  Procedure(s) Performed: KNEE ARTHROSCOPY WITH MEDIAL MENISCECTOMY (Right: Knee)  Patient Location: PACU  Anesthesia Type:General  Level of Consciousness: drowsy and patient cooperative  Airway & Oxygen Therapy: Patient Spontanous Breathing and Patient connected to nasal cannula oxygen  Post-op Assessment: Report given to RN and Post -op Vital signs reviewed and stable  Post vital signs: Reviewed and stable  Last Vitals:  Vitals Value Taken Time  BP 113/64 01/09/22 1019  Temp    Pulse 69 01/09/22 1022  Resp 15 01/09/22 1022  SpO2 98 % 01/09/22 1022  Vitals shown include unvalidated device data.  Last Pain:  Vitals:   01/09/22 0852  TempSrc: Oral  PainSc: 3       Patients Stated Pain Goal: 5 (01/09/22 5176)  Complications: No notable events documented.

## 2022-01-09 NOTE — Interval H&P Note (Signed)
History and Physical Interval Note:  01/09/2022 8:52 AM  Richard Wagner  has presented today for surgery, with the diagnosis of Torn medial meniscus right knee.  The various methods of treatment have been discussed with the patient and family. After consideration of risks, benefits and other options for treatment, the patient has consented to  Procedure(s): KNEE ARTHROSCOPY WITH MEDIAL MENISCECTOMY (Right) as a surgical intervention.  The patient's history has been reviewed, patient examined, no change in status, stable for surgery.  I have reviewed the patient's chart and labs.  Questions were answered to the patient's satisfaction.     Fuller Canada

## 2022-01-09 NOTE — Anesthesia Procedure Notes (Signed)
Procedure Name: LMA Insertion Date/Time: 01/09/2022 9:22 AM  Performed by: Pearson Grippe, CRNAPre-anesthesia Checklist: Patient identified, Emergency Drugs available, Suction available and Patient being monitored Patient Re-evaluated:Patient Re-evaluated prior to induction Oxygen Delivery Method: Circle system utilized Preoxygenation: Pre-oxygenation with 100% oxygen Induction Type: IV induction Ventilation: Mask ventilation without difficulty LMA: LMA inserted LMA Size: 5.0 Number of attempts: 1 Airway Equipment and Method: Bite block Placement Confirmation: positive ETCO2 Tube secured with: Tape Dental Injury: Teeth and Oropharynx as per pre-operative assessment

## 2022-01-09 NOTE — Brief Op Note (Signed)
01/09/2022  10:15 AM  Knee arthroscopy dictation  Preop diagnosis torn medial meniscus right knee  Postop diagnosis same  Procedure arthroscopy arthroscopy partial medial meniscectomy  Surgeon Romeo Apple  Operative findings  MEDIAL - meniscus posterior horn tear medial meniscus primarily inferior surface -articular surface grade 2 diffuse chondromalacia  LATERAL - meniscus normal -articular surface normal  PTF   -articular surface normal  NOTCH  -acl normal -pcl normal     The patient was identified in the preoperative holding area using 2 approved identification mechanisms. The chart was reviewed and updated. The surgical site was confirmed as right knee and marked with an indelible marker.  The patient was taken to the operating room for anesthesia. After successful LMA general anesthesia, Ancef 2 g was used as IV antibiotics.  The patient was placed in the supine position with the (right) the operative extremity in an arthroscopic leg holder and the opposite extremity in a padded leg holder.  The timeout was executed.  A lateral portal was established with an 11 blade and the scope was introduced into the joint. A diagnostic arthroscopy was performed in circumferential manner examining the entire knee joint. A medial portal was established and the diagnostic arthroscopy was repeated using a probe to palpate intra-articular structures as they were encountered.    The medial meniscus was resected using a duckbill forceps. The meniscal fragments were removed with a motorized shaver. The meniscus was balanced with a motorized shaver until a stable rim was obtained care was taken to thoroughly evaluate the remaining meniscus   The arthroscopic pump was placed on the wash mode and any excess debris was removed from the joint using suction.  60 cc of Marcaine with epinephrine was injected through the arthroscope.  The portals were closed with 3-0 nylon suture.  A  sterile bandage, Ace wrap and Cryo/Cuff was placed and the Cryo/Cuff was activated. The patient was taken to the recovery room in stable condition.  PHYSICIAN ASSISTANT: no  ASSISTANTS: none   ANESTHESIA:   LMA general  EBL:  none   BLOOD ADMINISTERED:none  DRAINS: none   LOCAL MEDICATIONS USED:  MARCAINE     SPECIMEN:  No Specimen  DISPOSITION OF SPECIMEN:  N/A  COUNTS:  YES   DICTATION: .Dragon Dictation  PLAN OF CARE: Discharge to home after PACU  PATIENT DISPOSITION:  PACU - hemodynamically stable.   Delay start of Pharmacological VTE agent (>24hrs) due to surgical blood loss or risk of bleeding: not applicable  POST OP PLAN  WB as tolerated SUTURES OUT IN A WEEK  AROM  BRACE none needed  01/09/2022  10:14 AM  PATIENT:  Richard Wagner  56 y.o. male  PRE-OPERATIVE DIAGNOSIS:  Torn medial meniscus right knee  POST-OPERATIVE DIAGNOSIS:  Torn medial meniscus right knee  PROCEDURE:  Procedure(s): KNEE ARTHROSCOPY WITH MEDIAL MENISCECTOMY (Right)  SURGEON:  Surgeon(s) and Role:    Vickki Hearing, MD - Primary  PHYSICIAN ASSISTANT:   ASSISTANTS: None  ANESTHESIA:   LMA general  EBL:  20 mL   BLOOD ADMINISTERED: None  DRAINS: None  LOCAL MEDICATIONS USED: Marcaine with epinephrine  SPECIMEN: None  DISPOSITION OF SPECIMEN: None  COUNTS: Correct  TOURNIQUET:  * Missing tourniquet times found for documented tourniquets in log: [id number] *  DICTATION: .Dragon  PLAN OF CARE: Discharge to home  PATIENT DISPOSITION: Stable   Delay start of Pharmacological VTE agent (>24hrs) due to surgical blood loss or risk of bleeding: Not applicable

## 2022-01-10 ENCOUNTER — Encounter (HOSPITAL_COMMUNITY): Payer: Self-pay | Admitting: Orthopedic Surgery

## 2022-01-17 ENCOUNTER — Other Ambulatory Visit: Payer: Self-pay | Admitting: Nurse Practitioner

## 2022-01-17 ENCOUNTER — Encounter: Payer: Self-pay | Admitting: Orthopedic Surgery

## 2022-01-17 ENCOUNTER — Ambulatory Visit (INDEPENDENT_AMBULATORY_CARE_PROVIDER_SITE_OTHER): Payer: 59 | Admitting: Orthopedic Surgery

## 2022-01-17 DIAGNOSIS — Z8673 Personal history of transient ischemic attack (TIA), and cerebral infarction without residual deficits: Secondary | ICD-10-CM

## 2022-01-17 DIAGNOSIS — E559 Vitamin D deficiency, unspecified: Secondary | ICD-10-CM

## 2022-01-17 DIAGNOSIS — E781 Pure hyperglyceridemia: Secondary | ICD-10-CM

## 2022-01-17 DIAGNOSIS — M13161 Monoarthritis, not elsewhere classified, right knee: Secondary | ICD-10-CM

## 2022-01-17 DIAGNOSIS — Z9889 Other specified postprocedural states: Secondary | ICD-10-CM | POA: Insufficient documentation

## 2022-01-17 MED ORDER — PREDNISONE 10 MG PO TABS: 10.0000 mg | ORAL_TABLET | Freq: Three times a day (TID) | ORAL | 0 refills | Status: AC

## 2022-01-17 NOTE — Progress Notes (Signed)
No longer our patient

## 2022-01-17 NOTE — Patient Instructions (Addendum)
Ice the knee 3 x A DAY   Do the exercises   Stop using the walker when you feel comfortable   Take tylenol 500 mg every 6 hrs with your oxycodone   And take the deltasone for the "heat" inflammation

## 2022-01-17 NOTE — Progress Notes (Signed)
FOLLOW UP   Encounter Diagnoses  Name Primary?   S/P right knee arthroscopy 01/09/22 Yes   Inflammation of joint of right knee      Chief Complaint  Patient presents with   Post-op Follow-up    Knee right/ 01/09/22 states very painful, not able to do the exercise (knee bends)due to pain    Meds ordered this encounter  Medications   predniSONE (DELTASONE) 10 MG tablet    Sig: Take 1 tablet (10 mg total) by mouth 3 (three) times daily.    Dispense:  42 tablet    Refill:  0     Barak is back for his first postop visit he complains of pain and popping in his knee the popping occurs when he extends the knee.  He has been sluggish with the exercises  He does have some knee swelling.  He says he feels heat in the knee.  There are no signs of infection he does have swelling is sore to touch  I think he has altered pain response due to his chronic back pain he is going to see his pain management specialist today usually takes 10 mg of oxycodone up to 3 times a day  I put him on some Deltasone to help with the inflammation in his knee I think he will do fine once he gets over the initial swelling  I will see him in 4 weeks

## 2022-02-14 ENCOUNTER — Ambulatory Visit: Payer: 59 | Admitting: Orthopedic Surgery

## 2022-08-23 ENCOUNTER — Encounter: Payer: Self-pay | Admitting: Radiology

## 2023-09-09 ENCOUNTER — Encounter: Payer: Self-pay | Admitting: Primary Care

## 2024-02-14 ENCOUNTER — Encounter: Payer: Self-pay | Admitting: Radiology

## 2024-04-27 ENCOUNTER — Encounter: Payer: Self-pay | Admitting: Radiology
